# Patient Record
Sex: Female | Born: 1970 | Race: Black or African American | Hispanic: No | Smoking: Former smoker
Health system: Southern US, Community
[De-identification: ages and names within clinical notes are randomized; demographics above are authoritative.]

## PROBLEM LIST (undated history)

## (undated) DIAGNOSIS — K649 Unspecified hemorrhoids: Secondary | ICD-10-CM

## (undated) DIAGNOSIS — D649 Anemia, unspecified: Secondary | ICD-10-CM

## (undated) DIAGNOSIS — M1990 Unspecified osteoarthritis, unspecified site: Secondary | ICD-10-CM

## (undated) DIAGNOSIS — R011 Cardiac murmur, unspecified: Secondary | ICD-10-CM

## (undated) DIAGNOSIS — E7800 Pure hypercholesterolemia, unspecified: Secondary | ICD-10-CM

## (undated) DIAGNOSIS — F419 Anxiety disorder, unspecified: Secondary | ICD-10-CM

## (undated) DIAGNOSIS — M25473 Effusion, unspecified ankle: Secondary | ICD-10-CM

## (undated) DIAGNOSIS — K219 Gastro-esophageal reflux disease without esophagitis: Secondary | ICD-10-CM

## (undated) HISTORY — DX: Unspecified hemorrhoids: K64.9

## (undated) HISTORY — DX: Effusion, unspecified ankle: M25.473

## (undated) HISTORY — DX: Unspecified osteoarthritis, unspecified site: M19.90

---

## 1997-07-03 ENCOUNTER — Other Ambulatory Visit: Admission: RE | Admit: 1997-07-03 | Discharge: 1997-07-03 | Payer: Self-pay | Admitting: Obstetrics

## 1999-05-03 ENCOUNTER — Encounter: Payer: Self-pay | Admitting: Emergency Medicine

## 1999-05-03 ENCOUNTER — Emergency Department (HOSPITAL_COMMUNITY): Admission: EM | Admit: 1999-05-03 | Discharge: 1999-05-03 | Payer: Self-pay | Admitting: Emergency Medicine

## 2000-03-23 ENCOUNTER — Other Ambulatory Visit: Admission: RE | Admit: 2000-03-23 | Discharge: 2000-03-23 | Payer: Self-pay

## 2000-10-14 ENCOUNTER — Inpatient Hospital Stay (HOSPITAL_COMMUNITY): Admission: AD | Admit: 2000-10-14 | Discharge: 2000-10-17 | Payer: Self-pay | Admitting: Obstetrics

## 2000-10-14 ENCOUNTER — Encounter (INDEPENDENT_AMBULATORY_CARE_PROVIDER_SITE_OTHER): Payer: Self-pay | Admitting: *Deleted

## 2000-10-30 ENCOUNTER — Emergency Department (HOSPITAL_COMMUNITY): Admission: EM | Admit: 2000-10-30 | Discharge: 2000-10-30 | Payer: Self-pay | Admitting: Emergency Medicine

## 2001-09-13 ENCOUNTER — Emergency Department (HOSPITAL_COMMUNITY): Admission: EM | Admit: 2001-09-13 | Discharge: 2001-09-14 | Payer: Self-pay | Admitting: Emergency Medicine

## 2001-09-26 ENCOUNTER — Encounter: Payer: Self-pay | Admitting: Internal Medicine

## 2001-09-26 ENCOUNTER — Encounter: Admission: RE | Admit: 2001-09-26 | Discharge: 2001-09-26 | Payer: Self-pay | Admitting: Internal Medicine

## 2002-01-08 ENCOUNTER — Encounter: Payer: Self-pay | Admitting: Emergency Medicine

## 2002-01-08 ENCOUNTER — Emergency Department (HOSPITAL_COMMUNITY): Admission: EM | Admit: 2002-01-08 | Discharge: 2002-01-08 | Payer: Self-pay | Admitting: Emergency Medicine

## 2002-01-29 ENCOUNTER — Emergency Department (HOSPITAL_COMMUNITY): Admission: EM | Admit: 2002-01-29 | Discharge: 2002-01-29 | Payer: Self-pay | Admitting: Emergency Medicine

## 2002-01-30 ENCOUNTER — Encounter: Admission: RE | Admit: 2002-01-30 | Discharge: 2002-01-30 | Payer: Self-pay | Admitting: Internal Medicine

## 2002-01-30 ENCOUNTER — Encounter: Payer: Self-pay | Admitting: Internal Medicine

## 2002-04-29 ENCOUNTER — Inpatient Hospital Stay (HOSPITAL_COMMUNITY): Admission: AD | Admit: 2002-04-29 | Discharge: 2002-04-29 | Payer: Self-pay | Admitting: Obstetrics

## 2005-07-14 ENCOUNTER — Emergency Department (HOSPITAL_COMMUNITY): Admission: EM | Admit: 2005-07-14 | Discharge: 2005-07-15 | Payer: Self-pay | Admitting: Emergency Medicine

## 2005-12-10 ENCOUNTER — Emergency Department (HOSPITAL_COMMUNITY): Admission: EM | Admit: 2005-12-10 | Discharge: 2005-12-10 | Payer: Self-pay | Admitting: Emergency Medicine

## 2006-02-20 ENCOUNTER — Emergency Department (HOSPITAL_COMMUNITY): Admission: EM | Admit: 2006-02-20 | Discharge: 2006-02-20 | Payer: Self-pay | Admitting: Emergency Medicine

## 2006-03-10 ENCOUNTER — Encounter: Admission: RE | Admit: 2006-03-10 | Discharge: 2006-03-10 | Payer: Self-pay | Admitting: Internal Medicine

## 2006-03-17 ENCOUNTER — Encounter: Admission: RE | Admit: 2006-03-17 | Discharge: 2006-03-17 | Payer: Self-pay | Admitting: Internal Medicine

## 2006-09-09 ENCOUNTER — Encounter: Admission: RE | Admit: 2006-09-09 | Discharge: 2006-09-09 | Payer: Self-pay | Admitting: Internal Medicine

## 2007-01-13 ENCOUNTER — Emergency Department (HOSPITAL_COMMUNITY): Admission: EM | Admit: 2007-01-13 | Discharge: 2007-01-13 | Payer: Self-pay | Admitting: Emergency Medicine

## 2007-04-15 ENCOUNTER — Emergency Department (HOSPITAL_COMMUNITY): Admission: EM | Admit: 2007-04-15 | Discharge: 2007-04-15 | Payer: Self-pay | Admitting: Emergency Medicine

## 2008-10-02 ENCOUNTER — Encounter: Admission: RE | Admit: 2008-10-02 | Discharge: 2008-10-02 | Payer: Self-pay | Admitting: Internal Medicine

## 2009-05-14 ENCOUNTER — Encounter: Admission: RE | Admit: 2009-05-14 | Discharge: 2009-05-14 | Payer: Self-pay | Admitting: Neurology

## 2009-08-14 ENCOUNTER — Emergency Department (HOSPITAL_COMMUNITY): Admission: EM | Admit: 2009-08-14 | Discharge: 2009-08-15 | Payer: Self-pay | Admitting: Emergency Medicine

## 2009-09-22 ENCOUNTER — Emergency Department (HOSPITAL_COMMUNITY): Admission: EM | Admit: 2009-09-22 | Discharge: 2009-09-22 | Payer: Self-pay | Admitting: Emergency Medicine

## 2010-06-23 LAB — BASIC METABOLIC PANEL
Chloride: 108 mEq/L (ref 96–112)
Creatinine, Ser: 0.62 mg/dL (ref 0.4–1.2)
GFR calc Af Amer: >60 mL/min (ref >60)
GFR calc non Af Amer: >60 mL/min (ref >60)
Glucose, Bld: 84 mg/dL (ref 70–99)
Sodium: 138 mEq/L (ref 135–145)

## 2010-06-23 LAB — URINALYSIS, ROUTINE W REFLEX MICROSCOPIC
Bilirubin Urine: NEGATIVE
Glucose, UA: NEGATIVE mg/dL
Hgb urine dipstick: NEGATIVE
Specific Gravity, Urine: 1.008 (ref 1.005–1.030)
Urobilinogen, UA: 0.2 mg/dL (ref 0.0–1.0)
pH: 5.5 (ref 5.0–8.0)

## 2010-06-23 LAB — RAPID URINE DRUG SCREEN, HOSP PERFORMED
Amphetamines: NOT DETECTED
Barbiturates: NOT DETECTED
Benzodiazepines: NOT DETECTED
Cocaine: NOT DETECTED
Opiates: NOT DETECTED
Tetrahydrocannabinol: NOT DETECTED

## 2010-06-23 LAB — DIFFERENTIAL
Band Neutrophils: 0 % (ref 0–10)
Basophils Absolute: 0 10*3/uL (ref 0.0–0.1)
Basophils Relative: 0 % (ref 0–1)
Eosinophils Absolute: 0.1 10*3/uL (ref 0.0–0.7)
Lymphocytes Relative: 22 % (ref 12–46)
Metamyelocytes Relative: 0 %
Neutro Abs: 4.9 10*3/uL (ref 1.7–7.7)
nRBC: 0 /100 WBC

## 2010-06-23 LAB — CBC
MCV: 72.8 fL — ABNORMAL LOW (ref 78.0–100.0)
RBC: 3.5 MIL/uL — ABNORMAL LOW (ref 3.87–5.11)
WBC: 7.3 10*3/uL (ref 4.0–10.5)

## 2010-06-23 LAB — ETHANOL: Alcohol, Ethyl (B): <5 mg/dL (ref 0–10)

## 2010-06-23 LAB — GLUCOSE, CAPILLARY: Glucose-Capillary: 81 mg/dL (ref 70–99)

## 2010-06-24 LAB — URINALYSIS, ROUTINE W REFLEX MICROSCOPIC
Bilirubin Urine: NEGATIVE
Nitrite: NEGATIVE
Protein, ur: NEGATIVE mg/dL
Urobilinogen, UA: 0.2 mg/dL (ref 0.0–1.0)

## 2010-06-24 LAB — POCT CARDIAC MARKERS: Myoglobin, poc: 59.9 ng/mL (ref 12–200)

## 2010-06-24 LAB — POCT I-STAT, CHEM 8
Calcium, Ion: 1.05 mmol/L — ABNORMAL LOW (ref 1.12–1.32)
Creatinine, Ser: 0.6 mg/dL (ref 0.4–1.2)
Glucose, Bld: 99 mg/dL (ref 70–99)
Hemoglobin: 10.5 g/dL — ABNORMAL LOW (ref 12.0–15.0)

## 2010-06-24 LAB — URINE MICROSCOPIC-ADD ON

## 2010-07-02 ENCOUNTER — Other Ambulatory Visit: Payer: Self-pay | Admitting: Internal Medicine

## 2010-07-02 DIAGNOSIS — Z1231 Encounter for screening mammogram for malignant neoplasm of breast: Secondary | ICD-10-CM

## 2010-07-04 ENCOUNTER — Ambulatory Visit
Admission: RE | Admit: 2010-07-04 | Discharge: 2010-07-04 | Disposition: A | Discharge status: Home / Self Care | Payer: Medicaid Other | Source: Ambulatory Visit | Attending: Internal Medicine | Admitting: Internal Medicine

## 2010-07-04 DIAGNOSIS — Z1231 Encounter for screening mammogram for malignant neoplasm of breast: Secondary | ICD-10-CM

## 2010-08-22 NOTE — Discharge Summary (Signed)
[facility]  Patient:    Audrey Rodriguez, Audrey Rodriguez                        MRN: [id number] Adm. Date:  [id number] Disc. Date: [id number] Attending:  Benny Lennert                           Discharge Summary  HISTORY:                      Patient is a 40 year old gravida 5, para 4-0-0-4 who had four previous cesarean sections who was now at term and was in for repeat cesarean section and tubal ligation.  On admission her hemoglobin was 11.5.  She underwent a repeat low transverse cesarean section and tubal ligation.  Had a female Apgar 9/9 weighing 6 pounds 5 ounces.  Postoperatively she did well.  Her hemoglobin was 10.3.  She was discharged home on the third postoperative day, ambulatory, on a regular diet to see me in six weeks.  DISCHARGE DIAGNOSES:          Status post repeat low transverse cesarean section at term and tubal ligation.DD:  11/04/00 TD:  11/04/00 Job: [id number]

## 2010-08-22 NOTE — Op Note (Signed)
[facility]  Patient:    Audrey Rodriguez, Audrey Rodriguez                        MRN: [id number] Proc. Date: 10/14/00 Adm. Date:  [id number] Attending:  Venita Sheffield                           Operative Report  PREOPERATIVE DIAGNOSIS:       Previous cesarean section at term for repeat cesarean section and tubal ligation.  POSTOPERATIVE DIAGNOSIS:      ______  OPERATION:                    Repeat cesarean section and tubal ligation.  SURGEON:                      Kathreen Cosier, M.D.  ASSISTANT:                    Bing Neighbors. Clearance Coots, M.D.  ANESTHESIA:                   Spinal.  ESTIMATED BLOOD LOSS:         400 cc.  DESCRIPTION OF PROCEDURE:     The patient was placed on the operating table in supine position. The abdomen was prepped and draped. The bladder was emptied with a Foley catheter. A midline incision was made through the old scar and carried down to the rectus fascia. The fascia was cleanly incised the length of the incision. The rectus muscles were retracted laterally. The peritoneum was incised the length of the incision. There was noted to be a window in the lower segment and the scalpel was used to incise this and with blunt dissection, the lower segment was opened, fluid was clear. The patient delivered from the OA position of a female, Apgars 9/9, weighing 6 pounds 5 ounces. There was nuchal cord x 1. The placenta was then ______ and removed manually. The uterine cavity was cleaned with dry laps. The uterine incision was closed in one layer with continuous suture of #1 chromic. Hemostasis was satisfactory. The right tube was grasped in the mid portion with a Babcock clamp, 0 plain suture placed on the mesosalpinx below the portion of tube in the clamp. This was tied and approximately one inch of tube transected. The procedure done in exact fashion on the other side. Lap and sponge counts correct. The abdomen was closed in layers;  the peritoneum with continuous suture of 0 chromic; the fascia with continuous suture of 0 Dexon, and the subcutaneous tissue closed with 2-0 plain and the skin closed with staples. Blood loss was 400 cc. The patient tolerated the procedure well and taken to recovery room in good condition. DD:  10/14/00 TD:  10/14/00 Job: [id number]

## 2010-12-25 LAB — I-STAT 8, (EC8 V) (CONVERTED LAB)
BUN: 14
Glucose, Bld: 88
HCT: 36
Hemoglobin: 12.2
Operator id: 161631
Potassium: 3.4 — ABNORMAL LOW
Sodium: 139
TCO2: 23

## 2010-12-25 LAB — CBC
Hemoglobin: 10.6 — ABNORMAL LOW
MCHC: 33
Platelets: 248
RDW: 13.8
WBC: 7.2

## 2010-12-25 LAB — POCT CARDIAC MARKERS: Operator id: 161631

## 2010-12-25 LAB — D-DIMER, QUANTITATIVE: D-Dimer, Quant: 0.29

## 2010-12-26 ENCOUNTER — Other Ambulatory Visit: Payer: Self-pay | Admitting: Obstetrics and Gynecology

## 2010-12-29 ENCOUNTER — Encounter (HOSPITAL_COMMUNITY): Payer: Self-pay | Admitting: *Deleted

## 2010-12-30 NOTE — H&P (Signed)
NAME:  Audrey Rodriguez, PADEN                 ACCOUNT NO.:  [id number]  MEDICAL RECORD NO.:  [id number]  LOCATION:                                 FACILITY:  PHYSICIAN:  Janine Limbo, M.D.DATE OF BIRTH:  30-Sep-1970  DATE OF ADMISSION: DATE OF DISCHARGE:                             HISTORY & PHYSICAL   HISTORY OF PRESENT ILLNESS:  Audrey Rodriguez is a 40 year old female, para 5-0- 0-5, who presents for hysteroscopy with dilatation and curettage.  The patient has [facility].  The patient has a known history of fibroids.  She complains of menorrhagia.  She has a known history of anemia.  She is currently taking vitamins and iron tablets.  An ultrasound was performed which showed an 11.36 x 4.88 cm uterus with multiple fibroids.  The largest fibroid measured 3.94 cm.  A hydrosonogram was performed and no endometrial polyps were noted.  The patient has had five cesarean sections in the past.  She has had a tubal ligation.  OBSTETRICAL HISTORY:  The patient has had five cesarean sections at term.  ALLERGIES:  No drug allergies, latex allergies, or Betadine allergies.  PAST MEDICAL HISTORY:  The patient has a history of anemia as mentioned above.  She has a history of hemorrhoids.  SOCIAL HISTORY:  The patient denies cigarette use, alcohol use, and recreational drug use.  She works as a Education administrator.  REVIEW OF SYSTEMS:  See history of present illness.  FAMILY HISTORY:  Noncontributory.  PHYSICAL EXAMINATION:  VITAL SIGNS:  Weight is 163 pounds.  Height is 5 feet, even. HEENT:  Within normal limits. CHEST:  Clear. HEART:  Regular rate and rhythm. BREASTS:  Without masses. ABDOMEN:  Nontender. EXTREMITIES:  Grossly normal. NEUROLOGIC:  Grossly normal. PELVIC:  External genitalia is normal.  Vagina is normal.  Cervix is nontender.  Uterus is approximately 12 weeks' size and  irregular. Adnexa no masses.  Rectovaginal exam confirms.  ASSESSMENT: 1. Menorrhagia. 2. Fibroid uterus. 3. Anemia.  PLAN:  The patient will undergo hysteroscopy with a dilatation and curettage.  She understands the indications for her surgical procedure, and she accepts the risks of, but not limited to, anesthetic complications, bleeding, infection, and possible damage to the surrounding organs.     Janine Limbo, M.D.     AVS/MEDQ  D:  12/30/2010  T:  12/30/2010  Job:  [id number]  cc:   Fleet Contras, M.D. Fax: [phone]

## 2010-12-31 ENCOUNTER — Ambulatory Visit (HOSPITAL_COMMUNITY)
Admission: RE | Admit: 2010-12-31 | Discharge: 2010-12-31 | Disposition: A | Discharge status: Home / Self Care | Payer: Medicaid Other | Source: Ambulatory Visit | Attending: Obstetrics and Gynecology | Admitting: Obstetrics and Gynecology

## 2010-12-31 ENCOUNTER — Encounter (HOSPITAL_COMMUNITY): Payer: Self-pay | Admitting: Anesthesiology

## 2010-12-31 ENCOUNTER — Ambulatory Visit (HOSPITAL_COMMUNITY): Payer: Medicaid Other | Admitting: Anesthesiology

## 2010-12-31 ENCOUNTER — Other Ambulatory Visit: Payer: Self-pay | Admitting: Obstetrics and Gynecology

## 2010-12-31 ENCOUNTER — Encounter (HOSPITAL_COMMUNITY)
Admission: RE | Disposition: A | Discharge status: Home / Self Care | Payer: Self-pay | Source: Ambulatory Visit | Attending: Obstetrics and Gynecology

## 2010-12-31 DIAGNOSIS — N920 Excessive and frequent menstruation with regular cycle: Secondary | ICD-10-CM | POA: Insufficient documentation

## 2010-12-31 DIAGNOSIS — Z8742 Personal history of other diseases of the female genital tract: Secondary | ICD-10-CM

## 2010-12-31 DIAGNOSIS — D649 Anemia, unspecified: Secondary | ICD-10-CM | POA: Insufficient documentation

## 2010-12-31 DIAGNOSIS — N840 Polyp of corpus uteri: Secondary | ICD-10-CM | POA: Insufficient documentation

## 2010-12-31 HISTORY — DX: Anemia, unspecified: D64.9

## 2010-12-31 HISTORY — DX: Cardiac murmur, unspecified: R01.1

## 2010-12-31 HISTORY — DX: Anxiety disorder, unspecified: F41.9

## 2010-12-31 HISTORY — DX: Gastro-esophageal reflux disease without esophagitis: K21.9

## 2010-12-31 LAB — CBC
MCH: 26.3 pg (ref 26.0–34.0)
Platelets: 341 10*3/uL (ref 150–400)
RBC: 4.1 MIL/uL (ref 3.87–5.11)
RDW: 14.7 % (ref 11.5–15.5)
WBC: 5.4 10*3/uL (ref 4.0–10.5)

## 2010-12-31 SURGERY — DILATATION & CURETTAGE/HYSTEROSCOPY WITH RESECTOCOPE
Anesthesia: General | Site: Vagina | Wound class: Clean Contaminated

## 2010-12-31 MED ORDER — MIDAZOLAM HCL 5 MG/5ML IJ SOLN
INTRAMUSCULAR | Status: DC | PRN
  Administered 2010-12-31: 2 mg via INTRAVENOUS

## 2010-12-31 MED ORDER — DEXAMETHASONE SODIUM PHOSPHATE 10 MG/ML IJ SOLN
INTRAMUSCULAR | Status: DC | PRN
  Administered 2010-12-31: 10 mg via INTRAVENOUS

## 2010-12-31 MED ORDER — ONDANSETRON HCL 4 MG/2ML IJ SOLN
INTRAMUSCULAR | Status: AC
  Filled 2010-12-31: qty 2

## 2010-12-31 MED ORDER — DEXAMETHASONE SODIUM PHOSPHATE 10 MG/ML IJ SOLN
INTRAMUSCULAR | Status: AC
  Filled 2010-12-31: qty 1

## 2010-12-31 MED ORDER — IBUPROFEN 200 MG PO TABS: 800.0000 mg | ORAL_TABLET | Freq: Three times a day (TID) | ORAL | Status: AC | PRN

## 2010-12-31 MED ORDER — MORPHINE SULFATE 0.5 MG/ML IJ SOLN
INTRAMUSCULAR | Status: AC
  Filled 2010-12-31: qty 10

## 2010-12-31 MED ORDER — MIDAZOLAM HCL 2 MG/2ML IJ SOLN
INTRAMUSCULAR | Status: AC
  Filled 2010-12-31: qty 2

## 2010-12-31 MED ORDER — LACTATED RINGERS IV SOLN
INTRAVENOUS | Status: DC
  Administered 2010-12-31: 10:00:00 via INTRAVENOUS

## 2010-12-31 MED ORDER — BUPIVACAINE-EPINEPHRINE 0.5% -1:200000 IJ SOLN
INTRAMUSCULAR | Status: DC | PRN
  Administered 2010-12-31: 10 mL

## 2010-12-31 MED ORDER — PROPOFOL 10 MG/ML IV EMUL
INTRAVENOUS | Status: AC
  Filled 2010-12-31: qty 20

## 2010-12-31 MED ORDER — LIDOCAINE HCL (CARDIAC) 20 MG/ML IV SOLN
INTRAVENOUS | Status: DC | PRN
  Administered 2010-12-31: 60 mg via INTRAVENOUS

## 2010-12-31 MED ORDER — KETOROLAC TROMETHAMINE 30 MG/ML IJ SOLN
INTRAMUSCULAR | Status: DC | PRN
  Administered 2010-12-31: 30 mg via INTRAMUSCULAR
  Administered 2010-12-31: 30 mg via INTRAVENOUS

## 2010-12-31 MED ORDER — GLYCINE 1.5 % IR SOLN
Status: DC | PRN
  Administered 2010-12-31: 3000 mL

## 2010-12-31 MED ORDER — FENTANYL CITRATE 0.05 MG/ML IJ SOLN
INTRAMUSCULAR | Status: DC | PRN
  Administered 2010-12-31 (×2): 50 ug via INTRAVENOUS

## 2010-12-31 MED ORDER — FENTANYL CITRATE 0.05 MG/ML IJ SOLN
INTRAMUSCULAR | Status: AC
  Filled 2010-12-31: qty 2

## 2010-12-31 MED ORDER — LIDOCAINE HCL (CARDIAC) 20 MG/ML IV SOLN
INTRAVENOUS | Status: AC
  Filled 2010-12-31: qty 5

## 2010-12-31 MED ORDER — KETOROLAC TROMETHAMINE 60 MG/2ML IM SOLN
INTRAMUSCULAR | Status: AC
  Filled 2010-12-31: qty 2

## 2010-12-31 MED ORDER — PROPOFOL 10 MG/ML IV EMUL
INTRAVENOUS | Status: DC | PRN
  Administered 2010-12-31: 170 mg via INTRAVENOUS

## 2010-12-31 MED ORDER — HYDROCODONE-ACETAMINOPHEN 5-500 MG PO TABS: 1.0000 | ORAL_TABLET | ORAL | Status: AC | PRN

## 2010-12-31 MED ORDER — ONDANSETRON HCL 4 MG/2ML IJ SOLN
INTRAMUSCULAR | Status: DC | PRN
  Administered 2010-12-31: 4 mg via INTRAVENOUS

## 2010-12-31 SURGICAL SUPPLY — 18 items
CANISTER SUCTION 2500CC (MISCELLANEOUS) ×2 IMPLANT
CATH ROBINSON RED A/P 16FR (CATHETERS) ×2 IMPLANT
CLOTH BEACON ORANGE TIMEOUT ST (SAFETY) ×2 IMPLANT
CONTAINER PREFILL 10% NBF 60ML (FORM) ×7 IMPLANT
DRAPE UTILITY XL STRL (DRAPES) ×2 IMPLANT
ELECT REM PT RETURN 9FT ADLT (ELECTROSURGICAL) ×2
ELECTRODE REM PT RTRN 9FT ADLT (ELECTROSURGICAL) IMPLANT
ELECTRODE ROLLER BARREL 22FR (ELECTROSURGICAL) IMPLANT
ELECTRODE VAPORCUT 22FR (ELECTROSURGICAL) IMPLANT
GLOVE BIOGEL PI IND STRL 8.5 (GLOVE) ×1 IMPLANT
GLOVE BIOGEL PI INDICATOR 8.5 (GLOVE) ×1
GLOVE ECLIPSE 8.0 STRL XLNG CF (GLOVE) ×4 IMPLANT
GOWN STRL NON-REIN LRG LVL3 (GOWN DISPOSABLE) ×2 IMPLANT
GOWN STRL REIN XL XLG (GOWN DISPOSABLE) ×2 IMPLANT
LOOP ANGLED CUTTING 22FR (CUTTING LOOP) ×1 IMPLANT
PACK HYSTEROSCOPY LF (CUSTOM PROCEDURE TRAY) ×2 IMPLANT
TOWEL OR 17X24 6PK STRL BLUE (TOWEL DISPOSABLE) ×4 IMPLANT
WATER STERILE IRR 1000ML POUR (IV SOLUTION) ×2 IMPLANT

## 2010-12-31 NOTE — H&P (Signed)
The patient was interviewed and examined today.  There are no changes to the previously documented history and physical examine. The operative procedure was reviewed. The risks and benefits were outlined again.  The patient's questions were answered.  We are ready to proceed as outlined.  

## 2010-12-31 NOTE — Anesthesia Preprocedure Evaluation (Addendum)
Anesthesia Evaluation  Name, MR# and DOB Patient awake  General Assessment Comment  Reviewed: Allergy & Precautions, H&P , NPO status , Patient's Chart, lab work & pertinent test results  Airway Mallampati: III TM Distance: >3 FB Neck ROM: full    Dental No notable dental hx. (+) Teeth Intact   Pulmonary  clear to auscultation  pulmonary exam normalPulmonary Exam Normal breath sounds clear to auscultation none    Cardiovascular regular Normal    Neuro/Psych    (+) Anxiety,  Negative Neurological ROS  Negative Psych ROS  GI/Hepatic/Renal negative GI ROS  negative Liver ROS  negative Renal ROS   Controlled     Endo/Other  Negative Endocrine ROS (+)      Abdominal   Musculoskeletal negative musculoskeletal ROS (+)   Hematology negative hematology ROS (+)   Peds  Reproductive/Obstetrics negative OB ROS    Anesthesia Other Findings            Anesthesia Physical Anesthesia Plan  ASA: II  Anesthesia Plan: General   Post-op Pain Management:    Induction:   Airway Management Planned:   Additional Equipment:   Intra-op Plan:   Post-operative Plan:   Informed Consent: I have reviewed the patients History and Physical, chart, labs and discussed the procedure including the risks, benefits and alternatives for the proposed anesthesia with the patient or authorized representative who has indicated his/her understanding and acceptance.   Dental Advisory Given  Plan Discussed with: Anesthesiologist  Anesthesia Plan Comments:         Anesthesia Quick Evaluation

## 2010-12-31 NOTE — Transfer of Care (Signed)
Immediate Anesthesia Transfer of Care Note  Patient: Audrey Rodriguez  Procedure(s) Performed:  DILATATION & CURETTAGE/HYSTEROSCOPY WITH RESECTOSCOPE - Dilatation & Currettage With Hysteroscopy; Resection Of Submucosal Fibroid  Patient Location: PACU  Anesthesia Type: General  Level of Consciousness: awake, alert , oriented and patient cooperative  Airway & Oxygen Therapy: Patient Spontanous Breathing and Patient connected to nasal cannula oxygen  Post-op Assessment: Report given to PACU RN and Post -op Vital signs reviewed and stable  Post vital signs: Reviewed  Complications: No apparent anesthesia complications

## 2010-12-31 NOTE — Preoperative (Signed)
Beta Blockers   Reason not to administer Beta Blockers:Not Applicable 

## 2010-12-31 NOTE — Op Note (Addendum)
Operative note  Audrey Rodriguez  Date of birth: Nov 29, 1970  Medical records number: [id number]  CSN: [id number]  Preoperative diagnosis:  Menorrhagia  Fibroids  Anemia (hemoglobin equals 10.8)  Postoperative diagnosis:  Menorrhagia  Fibroids  Anemia  Endometrial Polyps  Procedure:  Hysteroscopy with resection  Dilatation and curettage  Surgeon:  Leonard Schwartz M.D.  Assistant:  None  Anesthesia:  General  Disposition:  Audrey Rodriguez is a 40 year old female, para 5-0-0-5, who presents with menorrhagia and anemia. She understands the indications for surgical procedure and she accepts the risk of, but not limited to, anesthetic complications, bleeding, infections, and possible damage to the surrounding organs.  Findings:  The uterus is 12 week size. The patient was found to have 2 polyps within the endometrial cavity with the largest polyp measuring 1 cm. No other pathology was appreciated.  Procedure:  The patient was taken to the operating room where a general anesthetic was given. The patient's lower abdomen, perineum, and vagina were prepped with multiple layers of Betadine. The bladder was drained of urine. The patient was sterilely draped. Examination under anesthesia was performed. The patient was sterilely draped. A paracervical block was placed using half percent Marcaine with epinephrine. An endocervical curettage was performed. The uterus sounded to 12 cm. The cervix was gently dilated. The diagnostic hysteroscope was inserted and the cavity was carefully inspected. Pictures were taken. The diagnostic hysteroscope was removed. The cervix was dilated further. The operative hysteroscope was then inserted. The endometrial polyps were resected using a single loop. The hysteroscope was removed. The cavity was then curetted using a small curette. The cavity was felt to be clean at the end of our procedure. The hysteroscope was reinserted and the clean cavity  was confirmed. All instruments were removed. The exam was repeated and the uterus was noted to be firm. Needle and sponge counts were correct. The estimated blood loss was 20 cc. The estimated fluid deficit was 180 cc although through a loss noted on the operating room for. The patient tolerated her procedure well. Her anesthetic was reversed. She was transported to the recovery room in stable condition.  Followup instructions:  The patient will return to see Dr. Stefano Gaul in 2 weeks. She will call for questions or concerns. She was given a prescription for Motrin 800 mg every 8 hours as needed for pain and for Vicodin 5/500 and she will take one or 2 tablets every 4 hours as needed for severe pain.  Mylinda Latina.D.

## 2010-12-31 NOTE — Anesthesia Postprocedure Evaluation (Signed)
  Anesthesia Post-op Note  Patient: Audrey Rodriguez  Procedure(s) Performed:  DILATATION & CURETTAGE/HYSTEROSCOPY WITH RESECTOSCOPE - Dilatation & Currettage With Hysteroscopy; Resection Of Submucosal Fibroid  Patient Location: PACU  Anesthesia Type: General  Level of Consciousness: awake, alert  and oriented  Airway and Oxygen Therapy: Patient Spontanous Breathing  Post-op Pain: none  Post-op Assessment: Post-op Vital signs reviewed, Patient's Cardiovascular Status Stable, Respiratory Function Stable, Patent Airway, No signs of Nausea or vomiting and Pain level controlled  Post-op Vital Signs: Reviewed and stable  Complications: No apparent anesthesia complications

## 2011-01-15 LAB — DIFFERENTIAL
Basophils Absolute: 0
Eosinophils Absolute: 0.1
Eosinophils Relative: 1
Lymphocytes Relative: 26
Monocytes Absolute: 0.6

## 2011-01-15 LAB — URINALYSIS, ROUTINE W REFLEX MICROSCOPIC
Glucose, UA: NEGATIVE
Nitrite: NEGATIVE
Specific Gravity, Urine: 1.019
pH: 7

## 2011-01-15 LAB — CBC
HCT: 28.5 — ABNORMAL LOW
Hemoglobin: 9.4 — ABNORMAL LOW
MCHC: 32.9
MCV: 82.9
RDW: 14.1 — ABNORMAL HIGH

## 2011-01-15 LAB — URINE CULTURE: Colony Count: >=100000

## 2011-06-08 ENCOUNTER — Other Ambulatory Visit: Payer: Self-pay | Admitting: Internal Medicine

## 2011-06-08 DIAGNOSIS — Z1231 Encounter for screening mammogram for malignant neoplasm of breast: Secondary | ICD-10-CM

## 2011-07-08 ENCOUNTER — Ambulatory Visit
Admission: RE | Admit: 2011-07-08 | Discharge: 2011-07-08 | Disposition: A | Discharge status: Home / Self Care | Payer: Medicaid Other | Source: Ambulatory Visit | Attending: Internal Medicine | Admitting: Internal Medicine

## 2011-07-08 DIAGNOSIS — Z1231 Encounter for screening mammogram for malignant neoplasm of breast: Secondary | ICD-10-CM

## 2012-02-11 ENCOUNTER — Telehealth: Payer: Self-pay | Admitting: Obstetrics and Gynecology

## 2012-02-11 ENCOUNTER — Ambulatory Visit: Payer: Self-pay | Admitting: Obstetrics and Gynecology

## 2012-02-24 ENCOUNTER — Ambulatory Visit (INDEPENDENT_AMBULATORY_CARE_PROVIDER_SITE_OTHER): Payer: Medicaid Other | Admitting: Obstetrics and Gynecology

## 2012-02-24 ENCOUNTER — Encounter: Payer: Self-pay | Admitting: Obstetrics and Gynecology

## 2012-02-24 VITALS — BP 110/60 | HR 68 | Resp 16 | Ht 61.0 in | Wt 162.0 lb

## 2012-02-24 DIAGNOSIS — D259 Leiomyoma of uterus, unspecified: Secondary | ICD-10-CM

## 2012-02-24 DIAGNOSIS — Z124 Encounter for screening for malignant neoplasm of cervix: Secondary | ICD-10-CM

## 2012-02-24 DIAGNOSIS — D219 Benign neoplasm of connective and other soft tissue, unspecified: Secondary | ICD-10-CM | POA: Insufficient documentation

## 2012-02-24 DIAGNOSIS — Z113 Encounter for screening for infections with a predominantly sexual mode of transmission: Secondary | ICD-10-CM

## 2012-02-24 DIAGNOSIS — Z0000 Encounter for general adult medical examination without abnormal findings: Secondary | ICD-10-CM

## 2012-02-24 LAB — HIV ANTIBODY (ROUTINE TESTING W REFLEX): HIV: NONREACTIVE

## 2012-02-24 NOTE — Patient Instructions (Signed)
Over the counter Colace daily for constipation

## 2012-02-24 NOTE — Progress Notes (Signed)
Last Pap: 2008 from our records.No others done per patient.  No hx abnormal WNL: yes Regular Periods:yes Contraception: BTL  Monthly Breast exam:no Tetanus<89yrs:yes Nl.Bladder Function:yes Daily BMs:yes Healthy Diet:yes Calcium:no Mammogram:yes Date of Mammogram: 05/2011 WNL Exercise:yes Have often Exercise: 5 x a month Seatbelt: yes Abuse at home: no Stressful work:no Sigmoid-colonoscopy: None Bone Density: No PCP: [facility] Change in PMH: None Change in ZOX:WRUEAV died January 26, 2012   Subjective:    Audrey Rodriguez is a 41 y.o. female,  who presents for an annual exam. She has a hx of menorrhagia and underwent a hysteroscopy D&C a year ago with some decrease in monthly bleeding.  She is treated for anemia by her PCP and has constipation as a result of her iron therapy with hemorrhoids, but no rectal bleeding.    History   Social History  . Marital Status: Married    Spouse Name: N/A    Number of Children: N/A  . Years of Education: N/A   Social History Main Topics  . Smoking status: Former Smoker    Quit date: 12/28/1997  . Smokeless tobacco: None  . Alcohol Use: No  . Drug Use: No  . Sexually Active: Yes     Comment: BTL   Other Topics Concern  . None   Social History Narrative  . None    Menstrual cycle:   LMP: Patient's last menstrual period was 01/29/2012.           Cycle: q 28 days.  No IM bleeding.  No cramps.  Heavy for 4 days then slows.  The following portions of the patient's history were reviewed and updated as appropriate: allergies, current medications, past family history, past medical history, past social history, past surgical history and problem list.  Review of Systems Pertinent items are noted in HPI. Breast:Negative for breast lump,nipple discharge or nipple retraction Gastrointestinal: Negative for abdominal pain, change in bowel habits or rectal bleeding Urinary:negative   Objective:    BP 110/60  Pulse 68  Resp 16  Ht 5\' 1"   (1.549 m)  Wt 162 lb (73.483 kg)  BMI 30.61 kg/m2  LMP 01/29/2012    Weight:  Wt Readings from Last 1 Encounters:  02/24/12 162 lb (73.483 kg)          BMI: Body mass index is 30.61 kg/(m^2).  General Appearance: Alert, appropriate appearance for age. No acute distress HEENT: Grossly normal Neck / Thyroid: Supple, no masses, nodes or enlargement Lungs: clear to auscultation bilaterally Back: No CVA tenderness Breast Exam: No masses or nodes.No dimpling, nipple retraction or discharge. Cardiovascular: Regular rate and rhythm. S1, S2, no murmur Gastrointestinal: Soft, non-tender, no masses or organomegaly Pelvic Exam: External genitalia: normal general appearance Vaginal: normal rugae Cervix: normal appearance Adnexa: no separable masses Uterus: 12 weeks size with decreased mobility Rectovaginal: normal rectal, no masses Lymphatic Exam: Non-palpable nodes in neck, clavicular, axillary, or inguinal regions Skin: no rash or abnormalities Neurologic: Normal gait and speech, no tremor  Psychiatric: Alert and oriented, appropriate affect.   Wet Prep:not applicable Urinalysis:not applicable UPT: Not done   Assessment:    Fibroids   Menorrhagia, improved Anemia, under treatement Constipation related to meds Hemorrhoids Plan:    Mammogram due 2014 Feb. pap smear with HR HPV additional lab tests per orders, HGB OTC colace daily return annually or prn STD screening: declined Contraception:bilateral tubal ligation   Dierdre Forth MD

## 2012-02-26 LAB — PAP IG, CT-NG NAA, HPV HIGH-RISK: HPV DNA High Risk: NOT DETECTED

## 2012-03-07 ENCOUNTER — Encounter: Payer: Self-pay | Admitting: Obstetrics and Gynecology

## 2012-03-07 ENCOUNTER — Ambulatory Visit (INDEPENDENT_AMBULATORY_CARE_PROVIDER_SITE_OTHER): Payer: Medicaid Other | Admitting: Obstetrics and Gynecology

## 2012-03-07 VITALS — BP 140/80 | Temp 98.1°F | Ht 61.0 in | Wt 164.0 lb

## 2012-03-07 DIAGNOSIS — B009 Herpesviral infection, unspecified: Secondary | ICD-10-CM

## 2012-03-07 DIAGNOSIS — A609 Anogenital herpesviral infection, unspecified: Secondary | ICD-10-CM

## 2012-03-07 DIAGNOSIS — B1920 Unspecified viral hepatitis C without hepatic coma: Secondary | ICD-10-CM

## 2012-03-07 NOTE — Progress Notes (Signed)
HISTORY OF PRESENT ILLNESS  [name] is a 41 y.o. year old female,G5P5, who presents for a problem visit. Her Pap smear from November 2013 is within normal limits. The patient had screening tests done for sexually transmitted infections. Her HIV screen, high risk HPV,RPR, gonorrhea, Chlamydia, and hepatitis B test were negative. 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  Subjective:  The patient reports that she has had fever blisters in the past.  She has had a vulvar blisters in the past.  She has never been told that she had hepatitis C.  Her husband was incarcerated 2 years ago.  The patient reports that her husband told her that all of his tests were "negative".  Objective:  BP 140/80  Temp 98.1 F (36.7 C) (Oral)  Ht 5\' 1"  (1.549 m)  Wt 164 lb (74.39 kg)  BMI 30.99 kg/m2  LMP 03/04/2012   General: no distress GI: soft and nontender HEENT: Sclera are non-icteric.  Exam deferred.  Assessment:  Herpes virus 1  Herpes virus 2  Hepatitis C  Plan:  The above diagnoses were discussed.  We will schedule an appointment for the patient to be seen in the hepatitis clinic per her request.  Her husband is to be evaluated and treated as appropriate.  Return to office in 11  month(s).  25 min. Visit with 50% being face-to-face.   Leonard Schwartz M.D.  03/07/2012 5:52 PM

## 2012-03-08 ENCOUNTER — Other Ambulatory Visit: Payer: Self-pay

## 2012-03-08 ENCOUNTER — Telehealth: Payer: Self-pay

## 2012-03-08 DIAGNOSIS — B1920 Unspecified viral hepatitis C without hepatic coma: Secondary | ICD-10-CM

## 2012-03-08 NOTE — Telephone Encounter (Signed)
TC made  to the Hepatitis C Clinic today @ 9:55am  the office number was given to me by the Infectious Disease office. [phone]. No one was ava at this time   their voicemail stated if calling from another office for a referral to leave a fax number So that a Referral form can be faxed to our office I left a detailed brief message with the office contact number and fax #. Will await referral forms and call back with in 48 hours per their voicemail.  [facility] CMA

## 2012-03-08 NOTE — Telephone Encounter (Signed)
Referral Faxed to [facility] Attached were demo, ins card, pt last  clinical notes, results.  [facility] CMA

## 2012-03-09 ENCOUNTER — Telehealth: Payer: Self-pay | Admitting: Obstetrics and Gynecology

## 2012-03-10 ENCOUNTER — Telehealth: Payer: Self-pay | Admitting: Obstetrics and Gynecology

## 2012-03-10 ENCOUNTER — Telehealth: Payer: Self-pay

## 2012-03-10 NOTE — Telephone Encounter (Signed)
Pt was called back today. Pt not ava. Left Message for pt to call back.  [facility] CMA

## 2012-03-10 NOTE — Telephone Encounter (Signed)
Latoya, please see msgcl

## 2012-03-11 ENCOUNTER — Telehealth: Payer: Self-pay

## 2012-03-11 NOTE — Telephone Encounter (Signed)
Pt called back 03-10-12 And was made aware referral was faxed on 03-09-12 to the hepatitis C clinic And for her to allow 24-48 hours for them to call and schedule her an appt.  [facility] CMA

## 2012-07-04 ENCOUNTER — Other Ambulatory Visit: Payer: Self-pay

## 2012-07-04 DIAGNOSIS — Z1231 Encounter for screening mammogram for malignant neoplasm of breast: Secondary | ICD-10-CM

## 2012-08-05 ENCOUNTER — Ambulatory Visit: Payer: Medicaid Other

## 2012-09-13 ENCOUNTER — Ambulatory Visit
Admission: RE | Admit: 2012-09-13 | Discharge: 2012-09-13 | Disposition: A | Discharge status: Home / Self Care | Payer: Medicaid Other | Source: Ambulatory Visit

## 2012-09-13 DIAGNOSIS — Z1231 Encounter for screening mammogram for malignant neoplasm of breast: Secondary | ICD-10-CM

## 2012-11-01 ENCOUNTER — Emergency Department (HOSPITAL_COMMUNITY)
Admission: EM | Admit: 2012-11-01 | Discharge: 2012-11-01 | Disposition: A | Discharge status: Home / Self Care | Payer: Self-pay

## 2012-11-01 ENCOUNTER — Encounter (HOSPITAL_COMMUNITY): Payer: Self-pay | Admitting: Emergency Medicine

## 2012-11-01 ENCOUNTER — Emergency Department (HOSPITAL_COMMUNITY)
Admission: EM | Admit: 2012-11-01 | Discharge: 2012-11-01 | Disposition: A | Discharge status: Home / Self Care | Payer: Medicaid Other | Attending: Emergency Medicine | Admitting: Emergency Medicine

## 2012-11-01 DIAGNOSIS — Z862 Personal history of diseases of the blood and blood-forming organs and certain disorders involving the immune mechanism: Secondary | ICD-10-CM | POA: Insufficient documentation

## 2012-11-01 DIAGNOSIS — M25559 Pain in unspecified hip: Secondary | ICD-10-CM | POA: Insufficient documentation

## 2012-11-01 DIAGNOSIS — Z8719 Personal history of other diseases of the digestive system: Secondary | ICD-10-CM | POA: Insufficient documentation

## 2012-11-01 DIAGNOSIS — Z8659 Personal history of other mental and behavioral disorders: Secondary | ICD-10-CM | POA: Insufficient documentation

## 2012-11-01 DIAGNOSIS — K921 Melena: Secondary | ICD-10-CM | POA: Insufficient documentation

## 2012-11-01 DIAGNOSIS — Z7982 Long term (current) use of aspirin: Secondary | ICD-10-CM | POA: Insufficient documentation

## 2012-11-01 DIAGNOSIS — Z87891 Personal history of nicotine dependence: Secondary | ICD-10-CM | POA: Insufficient documentation

## 2012-11-01 DIAGNOSIS — R011 Cardiac murmur, unspecified: Secondary | ICD-10-CM | POA: Insufficient documentation

## 2012-11-01 DIAGNOSIS — K649 Unspecified hemorrhoids: Secondary | ICD-10-CM | POA: Insufficient documentation

## 2012-11-01 LAB — CBC WITH DIFFERENTIAL/PLATELET
Basophils Absolute: 0 10*3/uL (ref 0.0–0.1)
Eosinophils Absolute: 0.1 10*3/uL (ref 0.0–0.7)
Eosinophils Relative: 1 % (ref 0–5)
MCH: 27.2 pg (ref 26.0–34.0)
MCV: 82.5 fL (ref 78.0–100.0)
Platelets: 321 10*3/uL (ref 150–400)
RDW: 13.3 % (ref 11.5–15.5)

## 2012-11-01 LAB — COMPREHENSIVE METABOLIC PANEL
ALT: 20 U/L (ref 0–35)
AST: 20 U/L (ref 0–37)
Calcium: 9.2 mg/dL (ref 8.4–10.5)
Creatinine, Ser: 0.72 mg/dL (ref 0.50–1.10)
GFR calc Af Amer: >90 mL/min (ref >90)
Glucose, Bld: 112 mg/dL — ABNORMAL HIGH (ref 70–99)
Sodium: 137 mEq/L (ref 135–145)
Total Protein: 7 g/dL (ref 6.0–8.3)

## 2012-11-01 MED ORDER — HYDROCORTISONE 2.5 % RE CREA: TOPICAL_CREAM | Freq: Two times a day (BID) | RECTAL | Status: DC

## 2012-11-01 MED ORDER — HYDROCORTISONE ACETATE 25 MG RE SUPP: 25.0000 mg | Freq: Two times a day (BID) | RECTAL | Status: DC

## 2012-11-01 NOTE — ED Notes (Signed)
Pt c/o rectal bleeding x 3 days; pt denies pain and sts may have hemorrhoids

## 2012-11-01 NOTE — ED Notes (Signed)
Pt reports rectal bleeding X3 days.  States she has had this in past and may have hemorroids again.  Pt states she has very little pain 2/10 mostly uncomfortable.  Pt alert oriented X4

## 2012-11-01 NOTE — ED Provider Notes (Signed)
I saw and evaluated the patient, reviewed the resident's note and I agree with the findings and plan. Patient with a history of lower GI bleeding and has been asymptomatic for the last 2 days. Patient states occasionally the blood is dark and sometimes bright red. It occurs with every bowel movement. She denies any symptoms concerning for symptomatic anemia. On anoscopy visualized a an internal hemorrhoid that was engorged at 7:00 but not actively bleeding. Patient's symptoms may all be do to an internal hemorrhoid however this also could be diverticulosis. Recommended followup with GI and gave strict return precautions for any symptoms concerning for symptomatic anemia  Gwyneth Sprout, MD 11/01/12 1840

## 2012-11-01 NOTE — ED Provider Notes (Signed)
CSN: [id number]     Arrival date & time 11/01/12  1244 History     First MD Initiated Contact with Patient 11/01/12 1458     Chief Complaint  Patient presents with  . 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  She reports that she has had external hemorrhoids for past 7 years with painless rectal bleeding with BM on and off but never with every BM back to back. She does not remember when the last episode was.  For the past 2 days she had painless rectal bleeding with every BM. Blood is mixed in with stool and on wiping that is bright red to dark red in color. She also reports presence of small clots. No itching. She does not use topical rectal cream or rectal suppository and currently takes aspirin.    No fevers, weight loss, abdominal pain, nausea, vomiting, or changes in BM. She has iron deficiency anemia due to fibroid bleeding and takes oral iron supplements but has not taken it in about a month ago. No lightheadedness, dyspnea, or fatigue.  She has never had a colonoscopy or anoscopy in the past. No FH of GI disease but history of hemorrhoids.       Past Medical History  Diagnosis Date  . Heart murmur   . Anemia   . GERD (gastroesophageal reflux disease)     no meds  . Anxiety    Past Surgical History  Procedure Laterality Date  . Cesarean section      x 5   Family History  Problem Relation Age of Onset  . Heart disease Mother   . Diabetes Father   . Kidney disease Father    History  Substance Use Topics  . Smoking status: Former Smoker    Quit date: 12/28/1997  . Smokeless tobacco: Never Used  . Alcohol Use: No   OB History   Grav Para Term Preterm Abortions TAB SAB Ect Mult Living   5 5        5      Review of Systems  Constitutional: Negative for chills, diaphoresis and  fatigue.  Respiratory: Negative for cough and shortness of breath.   Cardiovascular: Negative for chest pain, palpitations and leg swelling.  Gastrointestinal: Positive for blood in stool and anal bleeding. Negative for nausea, vomiting, abdominal pain, diarrhea, constipation and rectal pain.  Genitourinary: Negative for hematuria, vaginal bleeding and difficulty urinating.  Musculoskeletal: Positive for arthralgias (left hip pain).  Skin: Negative for pallor.  Neurological: Negative for dizziness, weakness, light-headedness and headaches.    Allergies  Review of patient's allergies indicates no known allergies.  Home Medications   Current Outpatient Rx  Name  Route  Sig  Dispense  Refill  . aspirin 325 MG tablet   Oral   Take 162.5 mg by mouth daily as needed.          . Multiple Vitamin (MULTIVITAMIN WITH MINERALS) TABS   Oral   Take 1 tablet by mouth daily.         . Vitamin D, Ergocalciferol, (DRISDOL) 50000 UNITS CAPS   Oral   Take 50,000 Units by mouth every 7 (seven) days.          BP 132/74  Pulse 70  Temp(Src) 98 F (36.7 C) (Oral)  Resp 18  SpO2 99% Physical Exam  Constitutional: She is oriented to person, place, and time. She appears well-developed and well-nourished. No distress.  HENT:  Head: Normocephalic and atraumatic.  Eyes: EOM are normal.  Neck: Normal range of motion. Neck supple.  Cardiovascular: Normal rate and regular rhythm.   Murmur heard. Pulmonary/Chest: Effort normal and breath sounds normal. No respiratory distress. She has no wheezes. She has no rales. She exhibits no tenderness.  Abdominal: Soft. Bowel sounds are normal. She exhibits no distension. There is no tenderness. There is no rebound and no guarding.  Genitourinary:  Anoscopy - Non-inflamed, non bleeding external hemorrhoid  Swollen and distended non-bleeding internal hemorrhoid    Musculoskeletal: Normal range of motion. She exhibits no edema.  Neurological: She is alert  and oriented to person, place, and time.  Skin: Skin is warm and dry. She is not diaphoretic. No pallor.  Psychiatric: She has a normal mood and affect. Her behavior is normal.    ED Course   Procedures (including critical care time)  Labs Reviewed  CBC WITH DIFFERENTIAL - Abnormal; Notable for the following:    Hemoglobin 11.2 (*)    HCT 34.0 (*)    All other components within normal limits  COMPREHENSIVE METABOLIC PANEL - Abnormal; Notable for the following:    Glucose, Bld 112 (*)    Total Bilirubin 0.1 (*)    All other components within normal limits   No results found. 1. 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     Plan:  Painless Rectal Bleeding - Internal/external hemorrhoids vs fissure vs colon polyps vs diverticulosis vs IBD vs colitis vs colon cancer  -Perform anoscopy ---> swollen non-bleeding internal blood vessel, external non-inflamed non-bleeding hemorrhoid -Obtain Cbc w/diff ---> normocytic anemia with Hg/Hct above baseline -Obtain CMP ---> mild hyperglycemia    Disposition: Home ---> Pt hemodynamically stable (normotensive with H/H above baseline) and asymptomatic (denies fatigue, lightheadedness, dyspnea) with recent blood loss. Symptoms most likely due to bleeding internal hemorrhoid due to presence of swollen internal blood vessel on anoscopy and history of painless bleeding. Should see GI for follow-up including possible colonoscopy/sigmoidoscopy to  rule out other etiologies of painless rectal bleeding such as colon polyps, diverticulosis, and malginancy. No history of abdominal pain, wt loss, fever, or change in BM to suggest infectious or inflammatory causes.    Discharge Instructions: -To apply anusol 2.5% rectal cream BID as needed -To insert anusol  suppository (25mg ) BID for 7 days  -To f/u with GI or return to ED if bleeding worsens and becomes symptomatic due to anemia                 Otis Brace, MD 11/01/12 1746

## 2013-05-01 ENCOUNTER — Emergency Department (HOSPITAL_COMMUNITY)
Admission: EM | Admit: 2013-05-01 | Discharge: 2013-05-02 | Disposition: A | Discharge status: Home / Self Care | Payer: No Typology Code available for payment source | Attending: Emergency Medicine | Admitting: Emergency Medicine

## 2013-05-01 ENCOUNTER — Encounter (HOSPITAL_COMMUNITY): Payer: Self-pay | Admitting: Emergency Medicine

## 2013-05-01 DIAGNOSIS — R011 Cardiac murmur, unspecified: Secondary | ICD-10-CM | POA: Insufficient documentation

## 2013-05-01 DIAGNOSIS — Y9389 Activity, other specified: Secondary | ICD-10-CM | POA: Insufficient documentation

## 2013-05-01 DIAGNOSIS — S79929A Unspecified injury of unspecified thigh, initial encounter: Secondary | ICD-10-CM

## 2013-05-01 DIAGNOSIS — D649 Anemia, unspecified: Secondary | ICD-10-CM | POA: Insufficient documentation

## 2013-05-01 DIAGNOSIS — S99929A Unspecified injury of unspecified foot, initial encounter: Secondary | ICD-10-CM

## 2013-05-01 DIAGNOSIS — S8990XA Unspecified injury of unspecified lower leg, initial encounter: Secondary | ICD-10-CM | POA: Insufficient documentation

## 2013-05-01 DIAGNOSIS — IMO0002 Reserved for concepts with insufficient information to code with codable children: Secondary | ICD-10-CM | POA: Insufficient documentation

## 2013-05-01 DIAGNOSIS — S79919A Unspecified injury of unspecified hip, initial encounter: Secondary | ICD-10-CM | POA: Insufficient documentation

## 2013-05-01 DIAGNOSIS — Z8659 Personal history of other mental and behavioral disorders: Secondary | ICD-10-CM | POA: Insufficient documentation

## 2013-05-01 DIAGNOSIS — S3981XA Other specified injuries of abdomen, initial encounter: Secondary | ICD-10-CM | POA: Insufficient documentation

## 2013-05-01 DIAGNOSIS — Y92410 Unspecified street and highway as the place of occurrence of the external cause: Secondary | ICD-10-CM | POA: Insufficient documentation

## 2013-05-01 DIAGNOSIS — Z87891 Personal history of nicotine dependence: Secondary | ICD-10-CM | POA: Insufficient documentation

## 2013-05-01 DIAGNOSIS — S99919A Unspecified injury of unspecified ankle, initial encounter: Secondary | ICD-10-CM

## 2013-05-01 DIAGNOSIS — Z8719 Personal history of other diseases of the digestive system: Secondary | ICD-10-CM | POA: Insufficient documentation

## 2013-05-01 DIAGNOSIS — Z3202 Encounter for pregnancy test, result negative: Secondary | ICD-10-CM | POA: Insufficient documentation

## 2013-05-01 DIAGNOSIS — R110 Nausea: Secondary | ICD-10-CM | POA: Insufficient documentation
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
## 2013-05-02 ENCOUNTER — Emergency Department (HOSPITAL_COMMUNITY): Payer: No Typology Code available for payment source

## 2013-05-02 LAB — POCT I-STAT, CHEM 8
BUN: 11 mg/dL (ref 6–23)
CALCIUM ION: 1.22 mmol/L (ref 1.12–1.23)
CREATININE: 0.8 mg/dL (ref 0.50–1.10)
Chloride: 104 mEq/L (ref 96–112)
Glucose, Bld: 93 mg/dL (ref 70–99)
HCT: 42 % (ref 36.0–46.0)
Hemoglobin: 14.3 g/dL (ref 12.0–15.0)
Potassium: 3.8 mEq/L (ref 3.7–5.3)
Sodium: 142 mEq/L (ref 137–147)
TCO2: 27 mmol/L (ref 0–100)

## 2013-05-02 LAB — POCT PREGNANCY, URINE: Preg Test, Ur: NEGATIVE

## 2013-05-02 MED ORDER — IOHEXOL 300 MG/ML  SOLN
100.0000 mL | Freq: Once | INTRAMUSCULAR | Status: AC | PRN
  Administered 2013-05-02: 100 mL via INTRAVENOUS

## 2013-05-02 MED ORDER — HYDROCODONE-ACETAMINOPHEN 5-325 MG PO TABS: 1.0000 | ORAL_TABLET | Freq: Four times a day (QID) | ORAL | Status: DC | PRN

## 2013-05-02 MED ORDER — DIAZEPAM 5 MG PO TABS: 5.0000 mg | ORAL_TABLET | Freq: Two times a day (BID) | ORAL | Status: DC
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
## 2013-05-02 NOTE — ED Notes (Signed)
MD at bedside. 

## 2013-05-02 NOTE — ED Notes (Signed)
Patient transported to CT 
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
## 2013-05-05 ENCOUNTER — Encounter (HOSPITAL_COMMUNITY): Payer: Self-pay | Admitting: Emergency Medicine

## 2013-05-05 ENCOUNTER — Emergency Department (HOSPITAL_COMMUNITY)
Admission: EM | Admit: 2013-05-05 | Discharge: 2013-05-05 | Disposition: A | Discharge status: Home / Self Care | Payer: No Typology Code available for payment source | Attending: Emergency Medicine | Admitting: Emergency Medicine

## 2013-05-05 DIAGNOSIS — M79603 Pain in arm, unspecified: Secondary | ICD-10-CM

## 2013-05-05 DIAGNOSIS — Y92410 Unspecified street and highway as the place of occurrence of the external cause: Secondary | ICD-10-CM | POA: Insufficient documentation

## 2013-05-05 DIAGNOSIS — F411 Generalized anxiety disorder: Secondary | ICD-10-CM | POA: Insufficient documentation

## 2013-05-05 DIAGNOSIS — R011 Cardiac murmur, unspecified: Secondary | ICD-10-CM | POA: Insufficient documentation

## 2013-05-05 DIAGNOSIS — Z79899 Other long term (current) drug therapy: Secondary | ICD-10-CM | POA: Insufficient documentation

## 2013-05-05 DIAGNOSIS — Y9389 Activity, other specified: Secondary | ICD-10-CM | POA: Insufficient documentation

## 2013-05-05 DIAGNOSIS — M25519 Pain in unspecified shoulder: Secondary | ICD-10-CM | POA: Insufficient documentation

## 2013-05-05 DIAGNOSIS — M79606 Pain in leg, unspecified: Secondary | ICD-10-CM

## 2013-05-05 DIAGNOSIS — S7010XA Contusion of unspecified thigh, initial encounter: Secondary | ICD-10-CM | POA: Insufficient documentation

## 2013-05-05 DIAGNOSIS — Z87891 Personal history of nicotine dependence: Secondary | ICD-10-CM | POA: Insufficient documentation

## 2013-05-05 DIAGNOSIS — M79659 Pain in unspecified thigh: Secondary | ICD-10-CM

## 2013-05-05 DIAGNOSIS — M2569 Stiffness of other specified joint, not elsewhere classified: Secondary | ICD-10-CM | POA: Insufficient documentation

## 2013-05-05 DIAGNOSIS — Z8719 Personal history of other diseases of the digestive system: Secondary | ICD-10-CM | POA: Insufficient documentation

## 2013-05-05 DIAGNOSIS — D649 Anemia, unspecified: Secondary | ICD-10-CM | POA: Insufficient documentation

## 2013-05-05 DIAGNOSIS — M2560 Stiffness of unspecified joint, not elsewhere classified: Secondary | ICD-10-CM

## 2013-05-05 MED ORDER — METHOCARBAMOL 500 MG PO TABS: 500.0000 mg | ORAL_TABLET | Freq: Two times a day (BID) | ORAL | Status: DC | PRN
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
## 2013-05-05 NOTE — Discharge Instructions (Signed)
Take the prescribed medication as directed.  You may take this with your pain medication.  May also wish to apply heat to affected areas to help with bruising. Bruise will likely turn from blue --> purple --> green --> yellow... This is a normal progression of healing. Return to the ED for new or worsening symptoms.

## 2013-05-07 NOTE — ED Provider Notes (Signed)
Medical screening examination/treatment/procedure(s) were performed by non-physician practitioner and as supervising physician I was immediately available for consultation/collaboration.    Kathalene Frames, MD 05/07/13 1225

## 2013-06-17 ENCOUNTER — Encounter (HOSPITAL_COMMUNITY): Payer: Self-pay | Admitting: Emergency Medicine

## 2013-06-17 ENCOUNTER — Emergency Department (HOSPITAL_COMMUNITY): Payer: Medicaid Other

## 2013-06-17 ENCOUNTER — Emergency Department (HOSPITAL_COMMUNITY)
Admission: EM | Admit: 2013-06-17 | Discharge: 2013-06-17 | Disposition: A | Discharge status: Home / Self Care | Payer: Medicaid Other | Attending: Emergency Medicine | Admitting: Emergency Medicine

## 2013-06-17 DIAGNOSIS — F411 Generalized anxiety disorder: Secondary | ICD-10-CM | POA: Insufficient documentation

## 2013-06-17 DIAGNOSIS — R011 Cardiac murmur, unspecified: Secondary | ICD-10-CM | POA: Insufficient documentation

## 2013-06-17 DIAGNOSIS — R059 Cough, unspecified: Secondary | ICD-10-CM

## 2013-06-17 DIAGNOSIS — Z8719 Personal history of other diseases of the digestive system: Secondary | ICD-10-CM | POA: Insufficient documentation

## 2013-06-17 DIAGNOSIS — B9789 Other viral agents as the cause of diseases classified elsewhere: Secondary | ICD-10-CM

## 2013-06-17 DIAGNOSIS — Z79899 Other long term (current) drug therapy: Secondary | ICD-10-CM | POA: Insufficient documentation

## 2013-06-17 DIAGNOSIS — Z87891 Personal history of nicotine dependence: Secondary | ICD-10-CM | POA: Insufficient documentation

## 2013-06-17 DIAGNOSIS — R05 Cough: Secondary | ICD-10-CM

## 2013-06-17 DIAGNOSIS — Z862 Personal history of diseases of the blood and blood-forming organs and certain disorders involving the immune mechanism: Secondary | ICD-10-CM | POA: Insufficient documentation

## 2013-06-17 DIAGNOSIS — J069 Acute upper respiratory infection, unspecified: Secondary | ICD-10-CM

## 2013-06-17 MED ORDER — ALBUTEROL SULFATE HFA 108 (90 BASE) MCG/ACT IN AERS
2.0000 | INHALATION_SPRAY | Freq: Once | RESPIRATORY_TRACT | Status: AC
  Administered 2013-06-17: 2 via RESPIRATORY_TRACT
  Filled 2013-06-17: qty 6.7

## 2013-06-17 MED ORDER — BENZONATATE 100 MG PO CAPS: 100.0000 mg | ORAL_CAPSULE | Freq: Three times a day (TID) | ORAL | Status: DC

## 2013-06-17 NOTE — ED Provider Notes (Signed)
Medical screening examination/treatment/procedure(s) were performed by non-physician practitioner and as supervising physician I was immediately available for consultation/collaboration.  Carmin Muskrat, MD 06/17/13 [phone]

## 2013-06-17 NOTE — ED Provider Notes (Signed)
CSN: [id number]     Arrival date & time 06/17/13  1715 History   First MD Initiated Contact with Patient 06/17/13 1932     No chief complaint on file.    (Consider location/radiation/quality/duration/timing/severity/associated sxs/prior Treatment) The history is provided by the patient and medical records. No language interpreter was used.    Audrey Rodriguez is a 43 y.o. female  with a hx of anemia, GERD presents to the Emergency Department complaining of gradual, persistent, progressively worsening cough onset 1 week ago.  Pt reports associated rhinorrhea and mild sore throat.  She also reports mild chest pain with cough only.  Cough is productive of yellowish mucus.  Patient denies taking any over-the-counter treatments. Nothing makes the symptoms better or worse. She reports her cough is worse at night when she is trying to sleep. She also complains of hearing herself "wheeze" but has no Hx of asthma.  Pt denies fever, chills, headache, neck pain, shortness of breath, abdominal pain, nausea, vomiting, diarrhea, weakness, dizziness, syncope.  Pt also reports sick contacts with pneumonia.     Past Medical History  Diagnosis Date  . Heart murmur   . Anemia   . GERD (gastroesophageal reflux disease)     no meds  . Anxiety    Past Surgical History  Procedure Laterality Date  . Cesarean section      x 5   Family History  Problem Relation Age of Onset  . Heart disease Mother   . Diabetes Father   . Kidney disease Father    History  Substance Use Topics  . Smoking status: Former Smoker    Quit date: 12/28/1997  . Smokeless tobacco: Not on file  . Alcohol Use: No   OB History   Grav Para Term Preterm Abortions TAB SAB Ect Mult Living   5 5        5      Review of Systems  Constitutional: Negative for fever, chills, appetite change and fatigue.  HENT: Positive for congestion, postnasal drip, rhinorrhea, sinus pressure and sore throat. Negative for ear discharge, ear pain and  mouth sores.   Eyes: Negative for visual disturbance.  Respiratory: Positive for cough and wheezing. Negative for chest tightness, shortness of breath and stridor.   Cardiovascular: Positive for chest pain (with cough only). Negative for palpitations and leg swelling.  Gastrointestinal: Negative for nausea, vomiting, abdominal pain and diarrhea.  Genitourinary: Negative for dysuria, urgency, frequency and hematuria.  Musculoskeletal: Negative for arthralgias, back pain, myalgias and neck stiffness.  Skin: Negative for rash.  Neurological: Negative for syncope, light-headedness, numbness and headaches.  Hematological: Negative for adenopathy.  Psychiatric/Behavioral: The patient is not nervous/anxious.   All other systems reviewed and are negative.      Allergies  Review of patient's allergies indicates no known allergies.  Home Medications   Current Outpatient Rx  Name  Route  Sig  Dispense  Refill  . diazepam (VALIUM) 5 MG tablet   Oral   Take 1 tablet (5 mg total) by mouth 2 (two) times daily.   10 tablet   0   . ferrous sulfate 325 (65 FE) MG tablet   Oral   Take 325 mg by mouth daily with breakfast.         . guaiFENesin (MUCINEX) 600 MG 12 hr tablet   Oral   Take by mouth 2 (two) times daily as needed for cough.         . Multiple Vitamin (MULTIVITAMIN WITH  MINERALS) TABS   Oral   Take 1 tablet by mouth daily.         . Vitamin D, Ergocalciferol, (DRISDOL) 50000 UNITS CAPS   Oral   Take 50,000 Units by mouth every 7 (seven) days. On Monday         . benzonatate (TESSALON) 100 MG capsule   Oral   Take 1 capsule (100 mg total) by mouth every 8 (eight) hours.   21 capsule   0    BP 124/65  Pulse 80  Temp(Src) 98.1 F (36.7 C) (Oral)  Resp 18  SpO2 100%  LMP 05/22/2013 Physical Exam  Nursing note and vitals reviewed. Constitutional: She is oriented to person, place, and time. She appears well-developed and well-nourished. No distress.  Awake,  alert, nontoxic appearance  HENT:  Head: Normocephalic and atraumatic.  Right Ear: Tympanic membrane, external ear and ear canal normal.  Left Ear: Tympanic membrane, external ear and ear canal normal.  Nose: Mucosal edema and rhinorrhea present. No epistaxis. Right sinus exhibits no maxillary sinus tenderness and no frontal sinus tenderness. Left sinus exhibits no maxillary sinus tenderness and no frontal sinus tenderness.  Mouth/Throat: Uvula is midline, oropharynx is clear and moist and mucous membranes are normal. Mucous membranes are not pale and not cyanotic. No oropharyngeal exudate, posterior oropharyngeal edema, posterior oropharyngeal erythema or tonsillar abscesses.  Eyes: Conjunctivae are normal. Pupils are equal, round, and reactive to light. No scleral icterus.  Neck: Normal range of motion and full passive range of motion without pain. Neck supple.  Cardiovascular: Normal rate, regular rhythm, normal heart sounds and intact distal pulses.   No murmur heard. Regular rate and rhythm  Pulmonary/Chest: Effort normal and breath sounds normal. No accessory muscle usage or stridor. Not tachypneic. No respiratory distress. She has no decreased breath sounds. She has no wheezes. She has no rhonchi. She has no rales. She exhibits bony tenderness.  Clear and equal breath sounds, no wheezes, rhonchi or rales Mild tenderness to the bilateral ribs  Abdominal: Soft. Bowel sounds are normal. She exhibits no mass. There is no tenderness. There is no rebound and no guarding.  Abdomen soft and nontender  Musculoskeletal: Normal range of motion. She exhibits no edema.  Lymphadenopathy:    She has no cervical adenopathy.  Neurological: She is alert and oriented to person, place, and time.  Speech is clear and goal oriented Moves extremities without ataxia  Skin: Skin is warm and dry. No rash noted. She is not diaphoretic. No erythema.  Psychiatric: She has a normal mood and affect.    ED Course   Procedures (including critical care time) Labs Review Labs Reviewed - No data to display Imaging Review Dg Chest 2 View  06/17/2013   CLINICAL DATA:  Cough, shortness of breath  EXAM: CHEST  2 VIEW  COMPARISON:  DG CHEST 2 VIEW dated 09/22/2009  FINDINGS: The heart size and mediastinal contours are within normal limits. Both lungs are clear. The visualized skeletal structures are unremarkable.  IMPRESSION: No active cardiopulmonary disease.   Electronically Signed   By: Conchita Paris M.D.   On: 06/17/2013 20:27     EKG Interpretation None      MDM   Final diagnoses:  Cough  Viral URI with cough   Arnette Norris presents with 1 week of uri symptoms.  Pt CXR negative for acute infiltrate. I personally reviewed the imaging tests through PACS system.  I reviewed available ER/hospitalization records through the EMR.  Patients symptoms are consistent with URI, likely viral etiology. Discussed that antibiotics are not indicated for viral infections. Pt will be discharged with symptomatic treatment.  Verbalizes understanding and is agreeable with plan. Pt is hemodynamically stable & in NAD prior to dc.  It has been determined that no acute conditions requiring further emergency intervention are present at this time. The patient/guardian have been advised of the diagnosis and plan. We have discussed signs and symptoms that warrant return to the ED, such as changes or worsening in symptoms.   Vital signs are stable at discharge.   BP 124/65  Pulse 80  Temp(Src) 98.1 F (36.7 C) (Oral)  Resp 18  SpO2 100%  LMP 05/22/2013  Patient/guardian has voiced understanding and agreed to follow-up with the PCP or specialist.       Abigail Butts, PA-C 06/17/13 2057

## 2013-06-17 NOTE — Discharge Instructions (Signed)
1. Medications: Tessalon Perles, albuterol inhaler, usual home medications 2. Treatment: rest, drink plenty of fluids, Mucinex, or ibuprofen for pain control or fevers 3. Follow Up: Please followup with your primary doctor for discussion of your diagnoses and further evaluation after today's visit; if you do not have a primary care doctor use the resource guide provided to find one;    Read the instructions below on reasons to return to the emergency department and to learn more about your diagnosis.  Use over the counter medications for symptomatic relief as we discussed (musinex as a decongestant, Tylenol for fever/pain, Motrin/Ibuprofen for muscle aches). If prescribed a cough suppressant during your visit, do not operate heavy machinery with in 5 hours of taking this medication. Followup with your primary care doctor in 4 days if your symptoms persist.  Your more than welcome to return to the emergency department if symptoms worsen or become concerning.  Upper Respiratory Infection, Adult  An upper respiratory infection (URI) is also sometimes known as the common cold. Most people improve within 1 week, but symptoms can last up to 2 weeks. A residual cough may last even longer.   URI is most commonly caused by a virus. Viruses are NOT treated with antibiotics. You can easily spread the virus to others by oral contact. This includes kissing, sharing a glass, coughing, or sneezing. Touching your mouth or nose and then touching a surface, which is then touched by another person, can also spread the virus.   TREATMENT  Treatment is directed at relieving symptoms. There is no cure. Antibiotics are not effective, because the infection is caused by a virus, not by bacteria. Treatment may include:  Increased fluid intake. Sports drinks offer valuable electrolytes, sugars, and fluids.  Breathing heated mist or steam (vaporizer or shower).  Eating chicken soup or other clear broths, and maintaining good  nutrition.  Getting plenty of rest.  Using gargles or lozenges for comfort.  Controlling fevers with ibuprofen or acetaminophen as directed by your caregiver.  Increasing usage of your inhaler if you have asthma.  Return to work when your temperature has returned to normal.   SEEK MEDICAL CARE IF:  After the first few days, you feel you are getting worse rather than better.  You develop worsening shortness of breath, or brown or red sputum. These may be signs of pneumonia.  You develop yellow or brown nasal discharge or pain in the face, especially when you bend forward. These may be signs of sinusitis.  You develop a fever, swollen neck glands, pain with swallowing, or Delaney areas in the back of your throat. These may be signs of strep throat.    Emergency Department Resource Guide 1) Find a Doctor and Pay Out of Pocket Although you won't have to find out who is covered by your insurance plan, it is a good idea to ask around and get recommendations. You will then need to call the office and see if the doctor you have chosen will accept you as a new patient and what types of options they offer for patients who are self-pay. Some doctors offer discounts or will set up payment plans for their patients who do not have insurance, but you will need to ask so you aren't surprised when you get to your appointment.  2) Contact Your Local Health Department Not all health departments have doctors that can see patients for sick visits, but many do, so it is worth a call to see if yours does.  If you don't know where your local health department is, you can check in your phone book. The CDC also has a tool to help you locate your state's health department, and many state websites also have listings of all of their local health departments.  3) Find a [facility] If your illness is not likely to be very severe or complicated, you may want to try a walk in clinic. These are popping up all over the country  in pharmacies, drugstores, and shopping centers. They're usually staffed by nurse practitioners or physician assistants that have been trained to treat common illnesses and complaints. They're usually fairly quick and inexpensive. However, if you have serious medical issues or chronic medical problems, these are probably not your best option.  No Primary Care Doctor: - Call Health Connect at  [phone] - they can help you locate a primary care doctor that  accepts your insurance, provides certain services, etc. - Physician Referral Service- [phone]  Chronic Pain Problems: Organization         Address  Phone   Notes  [facility]  [phone] Patients need to be referred by their primary care doctor.   Medication Assistance: Organization         Address  Phone   Notes  [facility] [employer] Medication [facility] [address] [address] [place], [place] [address] [phone] --Must be a resident of [facility] -- Must have NO insurance coverage whatsoever (no Medicaid/ Medicare, etc.) -- The pt. MUST have a primary care doctor that directs their care regularly and follows them in the community   [facility]ist  [phone]   [employer]  [phone]    Agencies that provide inexpensive medical care: Organization         Address  Phone   Notes  [place]  [phone]   [facility]    [phone]   [facility] [place], [place] [address] [phone]   [place] [address], [place] [phone]   Planned Parenthood    [phone]   [facility]    [phone]   [place] and [employer] [address], [place] Phone:  [phone], Fax:  [phone] Hours of Operation:  9 am - 6 pm, M-F.  Also accepts Medicaid/Medicare and self-pay.  [facility] for [place], [address],  [place] Phone: [phone], Fax: [phone]. Hours of Operation:  8:30 am - 5:30 pm, M-F.  Also accepts Medicaid and self-pay.  [facility] [place] [address] [place] Phone: [phone]   [place], [place], [place] [phone], Ext. [phone] Mondays & Thursdays: 7-9 AM.  First 15 patients are seen on a first come, first serve basis.    [place] Providers:  Organization         Address  Phone   Notes  [facility] [address], [place] [phone] Also accepts self-pay patients.  [place], [place]  [phone]   [place], [address], [place] [phone]   [place] [address] [place] [phone]   Lucianne Lei [address], [place]   [phone] Only  accepts [employer] patients after they have their name applied to their card.   Self-Pay (no insurance) in [facility]:  Organization         Address  Phone   Notes  Sickle Cell Patients, [facility] [place] [phone]   [facility] Urgent Care [place] [phone]   [facility] [place]  [place], [place],  [phone]   [facility]/Dr. Osei-Bonsu  [address] [place] or [address], [place] [phone] Phone number for both [place] and [place] locations is the same.  Urgent Medical and [facility] [address], [place] [phone]   [facility] [address] [place] or [address] [phone]   [facility] [address], [place] [phone], phone; [phone], fax Sees patients 1st and 3rd Saturday of every month.  Must not  qualify for public or private insurance (i.e. Medicaid, Medicare, [place] Health Choice, Veterans' Benefits)  Household income should be no more than 200% of the poverty level The clinic cannot treat you if you are pregnant or think you are pregnant  Sexually transmitted diseases are not treated at the clinic.    Dental Care: Organization         Address  Phone  Notes  [facility] Department of [facility] [phone] Accepts children up to age 77 who are enrolled in [place] or [place]; pregnant women with a Medicaid card; and children who have applied for Medicaid or [place] Health Choice, but were declined, whose parents can pay a reduced fee at time of service.  [facility] Department of [facility]  [address], [place] [phone] Accepts children up to age 38 who are enrolled in [place] or [place]; pregnant women with a Medicaid card; and children who have applied for Medicaid or [place] Health Choice, but were declined, whose parents can pay a reduced fee at time of service.  [place] Adult Dental Access PROGRAM  [place] [phone] Patients are seen by appointment only. Walk-ins are not accepted. [place] will see patients 54 years of age and older. Monday - Tuesday (8am-5pm) Most Wednesdays (8:30-5pm) $30 per visit, cash only  [facility] Adult Dental Access PROGRAM  [address], [facility] [phone] Patients are seen by appointment only. Walk-ins are not accepted. [place] will see patients 82 years of age and older. One Wednesday Evening (Monthly: Volunteer Based).  $30 per visit, cash only  [place]  [phone] for adults; Children under age 44, call Graduate Pediatric Dentistry at [phone]. Children aged 63-14, please call [phone] to request a pediatric application.  Dental services are provided  in all areas of dental care including fillings, crowns and bridges, complete and partial dentures, implants, gum treatment, root canals, and extractions. Preventive care is also provided. Treatment is provided to both adults and children. Patients are selected via a lottery and there is often a waiting list.   [facility] [address] [place]  [phone] [email or web address], [place], [place] [phone], Ext. [phone] Second and Fourth Thursday of each month, opens at 6:30 AM; Clinic ends  at 9 AM.  Patients are seen on a first-come first-served basis, and a limited number are seen during each clinic.   [facility]  [address] Hillard Danker [place], [place] [phone]   Eligibility Requirements You must have lived in [place], [place], or [place] counties for at least the last three months.   You cannot be eligible for state or federal sponsored [employer], including [employer], [place], or [employer].   You generally cannot be eligible for healthcare insurance through your employer.    How to apply: Eligibility screenings are held every Tuesday and Wednesday afternoon from 1:00 pm until 4:00 pm. You do not need an appointment for the interview!  [facility] [address] [place], [place]   [place]  [place] Department  [place]  [phone]    Behavioral Health Resources in the Community: Intensive Outpatient Programs Organization         Address  Phone  Notes  [place]. [address] [place], [place] [phone]   [facility] [address] [place], [place]   ADS: Alcohol & Drug Svcs [address] [place], [place]   [place] [address],  [place], [place] or [phone]   Substance Abuse Resources Organization         Address  Phone  Notes  Alcohol and Drug Services  [phone]   [place]  [phone]   [facility] [place]   Chinita Pester  [phone]   Residential & Outpatient Substance Abuse Program  [phone]   Psychological Services Organization         Address  Phone  Notes  [facility] [place]  [place]  [phone]   [place] [address] [place] or [phone]    Mobile Crisis Teams Organization         Address  Phone  Notes  [facility], Mobile Crisis Care Unit  [phone]   Assertive Psychotherapeutic Services  [address] [place], [place]   [place] [address], [place] [phone]    Self-Help/Support Groups Organization         Address  Phone             Notes  [place]. of [place] - variety of support groups  [place] Call for more information  Narcotics Anonymous (NA), [facility] [address], [employer] [place]  2 meetings at this location   Special educational needs teacher         Address  Phone  Notes  [facility] [place],    [place]  [phone]   [facility]  [address] [place] [id number], [place], [place]   [place], [place] [phone] Admissions: 8am-3pm M-F  Incentives Substance [place] [address],    [place], [place] [phone]   [facility] [address] Jadene Pierini [place], [place]   [facility] [address]  [place], [place]   [facility] - Intensive Outpatient [address] Kristeen Mans 56, [place], [place]   [facility] ([place] [address] Madelaine Bhat  [place], [place] or  [phone]   Residential  Treatment Services (RTS) [address], [place], [place] Accepts Medicaid  [facility] [address]  [place] [phone] Substance Abuse/Addiction Treatment   [facility] Organization         Address  Phone  Notes  [facility]  [phone]   Domenic Schwab, PhD [address] Arlis [place], [place]   [phone] or [phone]   [place], [place] [phone]   [place] [address], [place], [place] [phone] Insurance/Medicaid/sponsorship through [facility] and Families [address], [address]                                    [place], [place] [phone] [place] [address], [place] [phone]    Dr. Adele Schilder  [phone]   [facility] Dept. 1) [address], [place] 2) [place] 3)  [place] [address], [place] [phone]  [phone]   [place] [phone] or [phone] (After Hours)

## 2013-06-20 ENCOUNTER — Telehealth (HOSPITAL_COMMUNITY): Payer: Self-pay

## 2013-10-02 ENCOUNTER — Encounter (HOSPITAL_COMMUNITY): Payer: Self-pay | Admitting: Emergency Medicine

## 2013-10-02 ENCOUNTER — Emergency Department (HOSPITAL_COMMUNITY)
Admission: EM | Admit: 2013-10-02 | Discharge: 2013-10-02 | Disposition: A | Discharge status: Home / Self Care | Payer: Medicaid Other | Attending: Emergency Medicine | Admitting: Emergency Medicine

## 2013-10-02 ENCOUNTER — Emergency Department (HOSPITAL_COMMUNITY): Payer: Medicaid Other

## 2013-10-02 DIAGNOSIS — D649 Anemia, unspecified: Secondary | ICD-10-CM | POA: Insufficient documentation

## 2013-10-02 DIAGNOSIS — R011 Cardiac murmur, unspecified: Secondary | ICD-10-CM | POA: Insufficient documentation

## 2013-10-02 DIAGNOSIS — M79609 Pain in unspecified limb: Secondary | ICD-10-CM | POA: Insufficient documentation

## 2013-10-02 DIAGNOSIS — Z8659 Personal history of other mental and behavioral disorders: Secondary | ICD-10-CM | POA: Insufficient documentation

## 2013-10-02 DIAGNOSIS — Z79899 Other long term (current) drug therapy: Secondary | ICD-10-CM | POA: Insufficient documentation

## 2013-10-02 DIAGNOSIS — M79601 Pain in right arm: Secondary | ICD-10-CM

## 2013-10-02 DIAGNOSIS — Z8719 Personal history of other diseases of the digestive system: Secondary | ICD-10-CM | POA: Insufficient documentation

## 2013-10-02 DIAGNOSIS — R42 Dizziness and giddiness: Secondary | ICD-10-CM | POA: Insufficient documentation

## 2013-10-02 DIAGNOSIS — Z87891 Personal history of nicotine dependence: Secondary | ICD-10-CM | POA: Insufficient documentation

## 2013-10-02 LAB — I-STAT TROPONIN, ED
TROPONIN I, POC: 0 ng/mL (ref 0.00–0.08)
TROPONIN I, POC: 0 ng/mL (ref 0.00–0.08)

## 2013-10-02 LAB — CBC
HEMATOCRIT: 36.7 % (ref 36.0–46.0)
Hemoglobin: 11.8 g/dL — ABNORMAL LOW (ref 12.0–15.0)
MCH: 26.7 pg (ref 26.0–34.0)
MCHC: 32.2 g/dL (ref 30.0–36.0)
MCV: 83 fL (ref 78.0–100.0)
Platelets: 333 10*3/uL (ref 150–400)
RBC: 4.42 MIL/uL (ref 3.87–5.11)
RDW: 13.8 % (ref 11.5–15.5)
WBC: 5.1 10*3/uL (ref 4.0–10.5)

## 2013-10-02 LAB — BASIC METABOLIC PANEL
BUN: 8 mg/dL (ref 6–23)
CALCIUM: 9.4 mg/dL (ref 8.4–10.5)
CO2: 23 mEq/L (ref 19–32)
Chloride: 102 mEq/L (ref 96–112)
Creatinine, Ser: 0.64 mg/dL (ref 0.50–1.10)
GFR calc Af Amer: >90 mL/min (ref >90)
GFR calc non Af Amer: >90 mL/min (ref >90)
Glucose, Bld: 98 mg/dL (ref 70–99)
Potassium: 4.2 mEq/L (ref 3.7–5.3)
Sodium: 141 mEq/L (ref 137–147)

## 2013-10-02 MED ORDER — ASPIRIN 81 MG PO CHEW
243.0000 mg | CHEWABLE_TABLET | Freq: Once | ORAL | Status: AC
  Administered 2013-10-02: 243 mg via ORAL
  Filled 2013-10-02: qty 3

## 2013-10-02 MED ORDER — ONDANSETRON HCL 4 MG/2ML IJ SOLN
4.0000 mg | Freq: Once | INTRAMUSCULAR | Status: DC
  Filled 2013-10-02: qty 2

## 2013-10-02 MED ORDER — MORPHINE SULFATE 4 MG/ML IJ SOLN
4.0000 mg | Freq: Once | INTRAMUSCULAR | Status: DC
  Filled 2013-10-02: qty 1

## 2013-10-02 NOTE — ED Notes (Addendum)
Pt reports sudden onset of left arm and shoulder pain today around 1100, denies injury to arm. Also reports feeling slightly dizzy. Denies pain increasing with movement.

## 2013-10-02 NOTE — ED Provider Notes (Signed)
CSN: [id number]     Arrival date & time 10/02/13  1053 History   First MD Initiated Contact with Patient 10/02/13 1501     Chief Complaint  Patient presents with  . Arm Pain     (Consider location/radiation/quality/duration/timing/severity/associated sxs/prior Treatment) HPI Comments: Patient is a 43 year old female with history of anemia, GERD, anxiety who presents today with left arm pain. She reports that this began around 11 AM when she was folding laundry. She denies any injury to the area. It is a dull pain. It is not worse with palpation or movement. She denies any chest pain or shortness of breath. No nausea, vomiting, diaphoresis. She reports that she did feel very anxious after this began. She had some associated lightheadedness which she states she gets with her anxiety. She has family history of early heart disease with her mother having her first MI in her 29s. She saw a cardiologist 15 years ago and was told that everything was normal. She does not smoke cigarettes.   Patient is a 43 y.o. female presenting with arm pain. The history is provided by the patient. No language interpreter was used.  Arm Pain Associated symptoms include myalgias. Pertinent negatives include no abdominal pain, chest pain, chills, diaphoresis, fever, nausea or vomiting.    Past Medical History  Diagnosis Date  . Heart murmur   . Anemia   . GERD (gastroesophageal reflux disease)     no meds  . Anxiety    Past Surgical History  Procedure Laterality Date  . Cesarean section      x 5   Family History  Problem Relation Age of Onset  . Heart disease Mother   . Diabetes Father   . Kidney disease Father    History  Substance Use Topics  . Smoking status: Former Smoker    Quit date: 12/28/1997  . Smokeless tobacco: Not on file  . Alcohol Use: No   OB History   Grav Para Term Preterm Abortions TAB SAB Ect Mult Living   5 5        5      Review of Systems  Constitutional: Negative for  fever, chills and diaphoresis.  Respiratory: Negative for shortness of breath.   Cardiovascular: Negative for chest pain.  Gastrointestinal: Negative for nausea, vomiting and abdominal pain.  Musculoskeletal: Positive for myalgias.  Neurological: Positive for light-headedness.  All other systems reviewed and are negative.     Allergies  Review of patient's allergies indicates no known allergies.  Home Medications   Prior to Admission medications   Medication Sig Start Date End Date Taking? Authorizing Provider  aspirin 81 MG tablet Take 81 mg by mouth daily.   Yes Historical Provider, MD  ferrous sulfate 325 (65 FE) MG tablet Take 325 mg by mouth daily as needed (for iron).    Yes Historical Provider, MD   BP 126/75  Pulse 62  Temp(Src) 98 F (36.7 C) (Oral)  Resp 18  Wt 155 lb (70.308 kg)  SpO2 100%  LMP 09/25/2013 Physical Exam  Nursing note and vitals reviewed. Constitutional: She is oriented to person, place, and time. She appears well-developed and well-nourished. She does not appear ill. No distress.  Patient appears comfortable.   HENT:  Head: Normocephalic and atraumatic.  Right Ear: External ear normal.  Left Ear: External ear normal.  Nose: Nose normal.  Mouth/Throat: Oropharynx is clear and moist.  Eyes: Conjunctivae are normal.  Neck: Normal range of motion.  Cardiovascular: Normal rate, regular  rhythm, normal heart sounds, intact distal pulses and normal pulses.   Pulses:      Radial pulses are 2+ on the right side, and 2+ on the left side.       Posterior tibial pulses are 2+ on the right side, and 2+ on the left side.  Cap refill < 3 seconds in all fingers.   Pulmonary/Chest: Effort normal and breath sounds normal. No stridor. No respiratory distress. She has no wheezes. She has no rales.  Abdominal: Soft. She exhibits no distension.  Musculoskeletal: Normal range of motion.  Full ROM of left arm. No tenderness to palpation. Compartment soft,  neurovascularly intact. No erythema, streaking, fluctuance, induration.   Neurological: She is alert and oriented to person, place, and time. She has normal strength.  Skin: Skin is warm and dry. She is not diaphoretic. No erythema.  Psychiatric: She has a normal mood and affect. Her behavior is normal.    ED Course  Procedures (including critical care time) Labs Review Labs Reviewed  CBC - Abnormal; Notable for the following:    Hemoglobin 11.8 (*)    All other components within normal limits  BASIC METABOLIC PANEL  I-STAT TROPOININ, ED  Randolm Idol, ED    Imaging Review Dg Chest 2 View  10/02/2013   CLINICAL DATA:  Arm pain  EXAM: CHEST  2 VIEW  COMPARISON:  06/17/2013  FINDINGS: Lateral view degraded by patient arm position. Minimal convex right thoracic spine curvature. Midline trachea. Normal heart size and mediastinal contours. No pleural effusion or pneumothorax. Clear lungs.  IMPRESSION: No acute cardiopulmonary disease.   Electronically Signed   By: Abigail Miyamoto M.D.   On: 10/02/2013 11:57     EKG Interpretation   Date/Time:  Monday October 02 2013 11:29:35 EDT Ventricular Rate:  62 PR Interval:  138 QRS Duration: 80 QT Interval:  414 QTC Calculation: 420 R Axis:   39 Text Interpretation:  Normal sinus rhythm Low voltage QRS Borderline ECG  No significant change was found Confirmed by [facility]  MD, TREY ([id number]) on  10/02/2013 3:44:20 PM      MDM   Final diagnoses:  Pain of right upper extremity   Patient presents to the emergency department with pain in her left arm. This is not reproducible with palpation or movement. She had no associated chest pain or shortness of breath. She has a heart score of 1, putting her in the low risk category for MACE. She has positive family history of her mother having an MI in her 70s. This upper extremity pain would be very atypical to be cardiac. Troponin negative x2. Patient was given outpatient cardiology referral as I believe  she will benefit from an outpatient stress test given this family history. Discussed reasons to return to the emergency Department immediately including chest pain, shortness of breath, pain with exertion, diaphoresis. Discussed case with Dr. Leonides Schanz who agrees with plan. Vital signs stable for discharge. Patient / Family / Caregiver informed of clinical course, understand medical decision-making process, and agree with plan.    Elwyn Lade, PA-C 10/04/13 0045

## 2013-10-02 NOTE — Discharge Instructions (Signed)

## 2013-10-04 NOTE — ED Provider Notes (Signed)
Medical screening examination/treatment/procedure(s) were performed by non-physician practitioner and as supervising physician I was immediately available for consultation/collaboration.   EKG Interpretation   Date/Time:  Monday October 02 2013 11:29:35 EDT Ventricular Rate:  62 PR Interval:  138 QRS Duration: 80 QT Interval:  414 QTC Calculation: 420 R Axis:   39 Text Interpretation:  Normal sinus rhythm Low voltage QRS Borderline ECG  No significant change was found Confirmed by [facility]  MD, TREY ([id number]) on  10/02/2013 3:44:20 PM        [place], DO 10/04/13 1450

## 2013-11-22 ENCOUNTER — Ambulatory Visit (INDEPENDENT_AMBULATORY_CARE_PROVIDER_SITE_OTHER): Payer: Medicaid Other | Admitting: Cardiology

## 2013-11-22 ENCOUNTER — Encounter: Payer: Self-pay | Admitting: Cardiology

## 2013-11-22 VITALS — BP 136/72 | HR 62 | Ht 63.0 in | Wt 153.0 lb

## 2013-11-22 DIAGNOSIS — M79602 Pain in left arm: Secondary | ICD-10-CM | POA: Insufficient documentation

## 2013-11-22 DIAGNOSIS — D509 Iron deficiency anemia, unspecified: Secondary | ICD-10-CM

## 2013-11-22 DIAGNOSIS — R011 Cardiac murmur, unspecified: Secondary | ICD-10-CM

## 2013-11-22 DIAGNOSIS — M79609 Pain in unspecified limb: Secondary | ICD-10-CM

## 2013-11-22 LAB — CBC WITH DIFFERENTIAL/PLATELET
BASOS ABS: 0 10*3/uL (ref 0.0–0.1)
BASOS PCT: 0.4 % (ref 0.0–3.0)
EOS ABS: 0.1 10*3/uL (ref 0.0–0.7)
Eosinophils Relative: 1.2 % (ref 0.0–5.0)
HEMATOCRIT: 33.9 % — AB (ref 36.0–46.0)
HEMOGLOBIN: 11.1 g/dL — AB (ref 12.0–15.0)
LYMPHS PCT: 36.7 % (ref 12.0–46.0)
Lymphs Abs: 1.9 10*3/uL (ref 0.7–4.0)
MCHC: 32.5 g/dL (ref 30.0–36.0)
MCV: 83.2 fl (ref 78.0–100.0)
Monocytes Absolute: 0.4 10*3/uL (ref 0.1–1.0)
Monocytes Relative: 8 % (ref 3.0–12.0)
Neutro Abs: 2.7 10*3/uL (ref 1.4–7.7)
Neutrophils Relative %: 53.7 % (ref 43.0–77.0)
Platelets: 348 10*3/uL (ref 150.0–400.0)
RBC: 4.08 Mil/uL (ref 3.87–5.11)
RDW: 14.3 % (ref 11.5–15.5)
WBC: 5.1 10*3/uL (ref 4.0–10.5)

## 2013-11-22 NOTE — Patient Instructions (Addendum)
Your physician recommends that you return for lab work in: [place] W/DIFF  Your physician recommends that you schedule a follow-up appointment in: [place] physician recommends that you continue on your current medications as directed. Please refer to the Current Medication list given to you today.

## 2013-11-22 NOTE — Progress Notes (Signed)
Audrey Rodriguez Date of Birth:  03/20/1971 [facility] [address] [place], [place]  [phone] [phone]        Fax   [phone]   History of Present Illness: This pleasant 43 year old African American woman is seen at the request of her primary care physician at [facility].  She is being seen because of a prior episode of left arm discomfort.  She was seen in the [facility] emergency room on 10/02/13 for evaluation of left arm pain.  The pain had started while she was folding laundry.  There was no associated shortness of breath or diaphoresis.  She did have some lightheadedness which she attributed to anxiety.  In the emergency room she had a CBC which showed mild anemia with hemoglobin of 11.8 and she had an EKG which showed low voltage but no ischemic changes.  She had negative troponins x2.  She has had no subsequent left arm pain.  She feels that it was in retrospect secondary to a muscle strain.  At no time has she been experiencing any chest discomfort.  The patient is not diabetic.  She denies any history of high blood pressure or hypercholesterolemia.  Normally she exercises at [employer].  She has not been experiencing any exertional chest or left arm symptoms.  Her weight has been stable. Social history reveals that she is married.  She has 5 children.  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 The patient has a history of iron deficiency anemia secondary to menometrorrhagia from fibroids.  She is taking oral iron. Her family history reveals that her mother had a heart attack in her 25s but is alive at age 72. Her father died of complications of diabetes.   Current Outpatient Prescriptions  Medication Sig Dispense Refill  . aspirin 81 MG tablet Take 81 mg by mouth daily.      . ferrous sulfate 325 (65 FE) MG tablet Take 325 mg by mouth daily as needed (for iron).        No current facility-administered medications for this  visit.    No Known Allergies  Patient Active Problem List   Diagnosis Date Noted  . Left arm pain 11/22/2013  . Iron deficiency anemia 11/22/2013  . Heart murmur previously undiagnosed 11/22/2013  . Fibroids 02/24/2012    History  Smoking status  . Former Smoker  . Quit date: 12/28/1997  Smokeless tobacco  . Not on file    History  Alcohol Use No    Family History  Problem Relation Age of Onset  . Heart disease Mother   . Diabetes Father   . Kidney disease Father     Review of Systems: Constitutional: no fever chills diaphoresis or fatigue or change in weight.  Head and neck: no hearing loss, no epistaxis, no photophobia or visual disturbance. Respiratory: No cough, shortness of breath or wheezing. Cardiovascular: No chest pain peripheral edema, palpitations. Gastrointestinal: No abdominal distention, no abdominal pain, no change in bowel habits hematochezia or melena. Genitourinary: No dysuria, no frequency, no urgency, no nocturia. Musculoskeletal:No arthralgias, no back pain, no gait disturbance or myalgias. Neurological: No dizziness, no headaches, no numbness, no seizures, no syncope, no weakness, no tremors. Hematologic: No lymphadenopathy, no easy bruising. Psychiatric: No confusion, no hallucinations, no sleep disturbance.    Physical Exam: Filed Vitals:   11/22/13 1418  BP: 136/72  Pulse: 62   the general appearance reveals  a well-developed well-nourished woman in no distress.The head and neck exam reveals pupils equal and reactive.  Extraocular movements are full.  There is no scleral icterus.  The mouth and pharynx are normal.  The neck is supple.  The carotids reveal no bruits.  The jugular venous pressure is normal.  The  thyroid is not enlarged.  There is no lymphadenopathy.  The chest is clear to percussion and auscultation.  There are no rales or rhonchi.  Expansion of the chest is symmetrical.  The precordium is quiet.  The first heart sound is  normal.  The second heart sound is physiologically split.  There is a grade 2/6 musical systolic ejection murmur at the left sternal edge and base.  The murmur is louder supine than upright.  There is no abnormal lift or heave.  The abdomen is soft and nontender.  The bowel sounds are normal.  The liver and spleen are not enlarged.  There are no abdominal masses.  There are no abdominal bruits.  Extremities reveal good pedal pulses.  There is no phlebitis or edema.  There is no cyanosis or clubbing.  Strength is normal and symmetrical in all extremities.  There is no lateralizing weakness.  There are no sensory deficits.  The skin is warm and dry.  There is no rash.  EKG today shows normal sinus rhythm and is within normal limits.  No ischemic changes.   Assessment / Plan: 1. left arm pain, without recurrence, felt to have represented musculoskeletal pain rather than ischemic heart pain. 2. iron deficiency anemia 3. heart murmur  Disposition: We will recheck her CBC today to be sure that her hemoglobin is stable or improving on oral iron. She will return for an echocardiogram to evaluate her systolic murmur.  We will also look for left ventricular function and wall motion abnormalities.  If her echocardiogram is satisfactory she will not need any further cardiac testing at this point.  We would want to see her back if she has any recurrent problems with left arm discomfort or if she begins to have any chest discomfort.  Many thanks for the opportunity to see this pleasant woman.

## 2013-11-24 ENCOUNTER — Ambulatory Visit (HOSPITAL_COMMUNITY): Payer: Medicaid Other | Attending: Cardiology | Admitting: Cardiology

## 2013-11-24 DIAGNOSIS — R011 Cardiac murmur, unspecified: Secondary | ICD-10-CM | POA: Insufficient documentation

## 2013-11-24 DIAGNOSIS — M79602 Pain in left arm: Secondary | ICD-10-CM

## 2013-11-24 DIAGNOSIS — D509 Iron deficiency anemia, unspecified: Secondary | ICD-10-CM

## 2013-11-24 NOTE — Progress Notes (Signed)
Echo performed. 

## 2013-11-27 ENCOUNTER — Telehealth: Payer: Self-pay | Admitting: Cardiology

## 2013-11-27 NOTE — Telephone Encounter (Signed)
Advised patient

## 2013-11-27 NOTE — Telephone Encounter (Signed)
Message copied by Earvin Hansen on Mon Nov 27, 2013  5:34 PM ------      Message from: Darlin Coco      Created: Sat Nov 25, 2013  6:08 PM       Please report.  The echo was normal. The murmur was probably secondary to her anemia. CSD ------

## 2013-11-27 NOTE — Telephone Encounter (Signed)
°  Patient is calling for test results, please call and advise.

## 2013-11-27 NOTE — Telephone Encounter (Signed)
Follow up ° ° ° ° °Returning a nurses call °

## 2013-11-28 ENCOUNTER — Other Ambulatory Visit: Payer: Self-pay

## 2013-11-28 DIAGNOSIS — Z1231 Encounter for screening mammogram for malignant neoplasm of breast: Secondary | ICD-10-CM

## 2013-12-06 ENCOUNTER — Ambulatory Visit
Admission: RE | Admit: 2013-12-06 | Discharge: 2013-12-06 | Disposition: A | Discharge status: Home / Self Care | Payer: Medicaid Other | Source: Ambulatory Visit

## 2013-12-06 DIAGNOSIS — Z1231 Encounter for screening mammogram for malignant neoplasm of breast: Secondary | ICD-10-CM

## 2014-02-05 ENCOUNTER — Encounter: Payer: Self-pay | Admitting: Cardiology

## 2014-06-18 ENCOUNTER — Encounter (HOSPITAL_COMMUNITY): Payer: Self-pay | Admitting: Emergency Medicine

## 2014-06-18 ENCOUNTER — Emergency Department (HOSPITAL_COMMUNITY)
Admission: EM | Admit: 2014-06-18 | Discharge: 2014-06-18 | Discharge status: Against Medical Advice | Payer: Medicaid Other | Attending: Emergency Medicine | Admitting: Emergency Medicine

## 2014-06-18 DIAGNOSIS — L02413 Cutaneous abscess of right upper limb: Secondary | ICD-10-CM | POA: Insufficient documentation

## 2014-06-18 DIAGNOSIS — R011 Cardiac murmur, unspecified: Secondary | ICD-10-CM | POA: Insufficient documentation

## 2014-06-18 NOTE — ED Notes (Signed)
Pt reports boil under R armpit. Denies drainage, fevers/chills.

## 2014-07-06 ENCOUNTER — Other Ambulatory Visit: Payer: Self-pay | Admitting: Internal Medicine

## 2014-07-06 DIAGNOSIS — N61 Mastitis without abscess: Secondary | ICD-10-CM

## 2014-07-06 DIAGNOSIS — N63 Unspecified lump in unspecified breast: Secondary | ICD-10-CM

## 2014-07-11 ENCOUNTER — Other Ambulatory Visit: Payer: Self-pay | Admitting: Internal Medicine

## 2014-07-11 DIAGNOSIS — N61 Mastitis without abscess: Secondary | ICD-10-CM

## 2014-07-11 DIAGNOSIS — N63 Unspecified lump in unspecified breast: Secondary | ICD-10-CM

## 2014-07-12 ENCOUNTER — Other Ambulatory Visit: Payer: Self-pay | Admitting: Internal Medicine

## 2014-07-16 ENCOUNTER — Ambulatory Visit
Admission: RE | Admit: 2014-07-16 | Discharge: 2014-07-16 | Disposition: A | Discharge status: Home / Self Care | Payer: Self-pay | Source: Ambulatory Visit | Attending: Internal Medicine | Admitting: Internal Medicine

## 2014-07-16 ENCOUNTER — Other Ambulatory Visit: Payer: Self-pay | Admitting: Internal Medicine

## 2014-07-16 DIAGNOSIS — N61 Mastitis without abscess: Secondary | ICD-10-CM

## 2014-07-16 DIAGNOSIS — N63 Unspecified lump in unspecified breast: Secondary | ICD-10-CM

## 2014-07-20 ENCOUNTER — Encounter (HOSPITAL_COMMUNITY): Payer: Self-pay | Admitting: Emergency Medicine

## 2014-07-20 ENCOUNTER — Emergency Department (HOSPITAL_COMMUNITY)
Admission: EM | Admit: 2014-07-20 | Discharge: 2014-07-20 | Disposition: A | Discharge status: Home / Self Care | Payer: No Typology Code available for payment source | Attending: Emergency Medicine | Admitting: Emergency Medicine

## 2014-07-20 DIAGNOSIS — Z8719 Personal history of other diseases of the digestive system: Secondary | ICD-10-CM | POA: Insufficient documentation

## 2014-07-20 DIAGNOSIS — S3992XA Unspecified injury of lower back, initial encounter: Secondary | ICD-10-CM | POA: Insufficient documentation

## 2014-07-20 DIAGNOSIS — Y9389 Activity, other specified: Secondary | ICD-10-CM | POA: Diagnosis not present

## 2014-07-20 DIAGNOSIS — Z79899 Other long term (current) drug therapy: Secondary | ICD-10-CM | POA: Diagnosis not present

## 2014-07-20 DIAGNOSIS — Y92410 Unspecified street and highway as the place of occurrence of the external cause: Secondary | ICD-10-CM | POA: Diagnosis not present

## 2014-07-20 DIAGNOSIS — R011 Cardiac murmur, unspecified: Secondary | ICD-10-CM | POA: Diagnosis not present

## 2014-07-20 DIAGNOSIS — Z8659 Personal history of other mental and behavioral disorders: Secondary | ICD-10-CM | POA: Insufficient documentation

## 2014-07-20 DIAGNOSIS — D649 Anemia, unspecified: Secondary | ICD-10-CM | POA: Insufficient documentation

## 2014-07-20 DIAGNOSIS — Y998 Other external cause status: Secondary | ICD-10-CM | POA: Insufficient documentation

## 2014-07-20 DIAGNOSIS — Z87891 Personal history of nicotine dependence: Secondary | ICD-10-CM | POA: Diagnosis not present

## 2014-07-20 MED ORDER — NAPROXEN 500 MG PO TABS: 500.0000 mg | ORAL_TABLET | Freq: Two times a day (BID) | ORAL | Status: DC

## 2014-07-20 MED ORDER — CYCLOBENZAPRINE HCL 5 MG PO TABS: 5.0000 mg | ORAL_TABLET | Freq: Three times a day (TID) | ORAL | Status: DC | PRN
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
## 2014-10-15 ENCOUNTER — Encounter (HOSPITAL_COMMUNITY): Payer: Self-pay | Admitting: Emergency Medicine

## 2014-10-15 ENCOUNTER — Emergency Department (HOSPITAL_COMMUNITY)
Admission: EM | Admit: 2014-10-15 | Discharge: 2014-10-15 | Discharge status: Against Medical Advice | Payer: Medicaid Other | Attending: Emergency Medicine | Admitting: Emergency Medicine

## 2014-10-15 DIAGNOSIS — S199XXA Unspecified injury of neck, initial encounter: Secondary | ICD-10-CM | POA: Diagnosis not present

## 2014-10-15 DIAGNOSIS — Y9389 Activity, other specified: Secondary | ICD-10-CM | POA: Insufficient documentation

## 2014-10-15 DIAGNOSIS — S4992XA Unspecified injury of left shoulder and upper arm, initial encounter: Secondary | ICD-10-CM | POA: Diagnosis not present

## 2014-10-15 DIAGNOSIS — Y92410 Unspecified street and highway as the place of occurrence of the external cause: Secondary | ICD-10-CM | POA: Insufficient documentation

## 2014-10-15 DIAGNOSIS — Y998 Other external cause status: Secondary | ICD-10-CM | POA: Insufficient documentation

## 2014-10-15 DIAGNOSIS — S3992XA Unspecified injury of lower back, initial encounter: Secondary | ICD-10-CM | POA: Insufficient documentation
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
## 2014-10-16 ENCOUNTER — Emergency Department (HOSPITAL_COMMUNITY)
Admission: EM | Admit: 2014-10-16 | Discharge: 2014-10-16 | Disposition: A | Discharge status: Home / Self Care | Payer: Medicaid Other | Attending: Emergency Medicine | Admitting: Emergency Medicine

## 2014-10-16 ENCOUNTER — Encounter (HOSPITAL_COMMUNITY): Payer: Self-pay | Admitting: Emergency Medicine

## 2014-10-16 DIAGNOSIS — S161XXA Strain of muscle, fascia and tendon at neck level, initial encounter: Secondary | ICD-10-CM | POA: Diagnosis not present

## 2014-10-16 DIAGNOSIS — Z791 Long term (current) use of non-steroidal anti-inflammatories (NSAID): Secondary | ICD-10-CM | POA: Diagnosis not present

## 2014-10-16 DIAGNOSIS — F419 Anxiety disorder, unspecified: Secondary | ICD-10-CM | POA: Diagnosis not present

## 2014-10-16 DIAGNOSIS — S3992XA Unspecified injury of lower back, initial encounter: Secondary | ICD-10-CM | POA: Diagnosis not present

## 2014-10-16 DIAGNOSIS — Y939 Activity, unspecified: Secondary | ICD-10-CM | POA: Insufficient documentation

## 2014-10-16 DIAGNOSIS — Y999 Unspecified external cause status: Secondary | ICD-10-CM | POA: Insufficient documentation

## 2014-10-16 DIAGNOSIS — E780 Pure hypercholesterolemia: Secondary | ICD-10-CM | POA: Diagnosis not present

## 2014-10-16 DIAGNOSIS — Z8719 Personal history of other diseases of the digestive system: Secondary | ICD-10-CM | POA: Diagnosis not present

## 2014-10-16 DIAGNOSIS — Y92410 Unspecified street and highway as the place of occurrence of the external cause: Secondary | ICD-10-CM | POA: Insufficient documentation

## 2014-10-16 DIAGNOSIS — R011 Cardiac murmur, unspecified: Secondary | ICD-10-CM | POA: Diagnosis not present

## 2014-10-16 DIAGNOSIS — Z79899 Other long term (current) drug therapy: Secondary | ICD-10-CM | POA: Insufficient documentation

## 2014-10-16 DIAGNOSIS — Z87891 Personal history of nicotine dependence: Secondary | ICD-10-CM | POA: Diagnosis not present

## 2014-10-16 DIAGNOSIS — S199XXA Unspecified injury of neck, initial encounter: Secondary | ICD-10-CM | POA: Diagnosis present

## 2014-10-16 DIAGNOSIS — Z862 Personal history of diseases of the blood and blood-forming organs and certain disorders involving the immune mechanism: Secondary | ICD-10-CM | POA: Insufficient documentation

## 2014-10-16 MED ORDER — CYCLOBENZAPRINE HCL 10 MG PO TABS: 10.0000 mg | ORAL_TABLET | Freq: Two times a day (BID) | ORAL | Status: DC | PRN

## 2014-10-16 MED ORDER — IBUPROFEN 800 MG PO TABS: 800.0000 mg | ORAL_TABLET | Freq: Three times a day (TID) | ORAL | Status: DC

## 2014-10-16 MED ORDER — CYCLOBENZAPRINE HCL 10 MG PO TABS
10.0000 mg | ORAL_TABLET | Freq: Two times a day (BID) | ORAL | Status: DC | PRN
Start: 2014-10-16 — End: 2014-10-16
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
## 2014-10-16 NOTE — Discharge Instructions (Signed)
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
## 2014-11-20 ENCOUNTER — Other Ambulatory Visit: Payer: Self-pay

## 2014-11-20 ENCOUNTER — Other Ambulatory Visit: Payer: Self-pay | Admitting: Internal Medicine

## 2014-11-20 DIAGNOSIS — N6310 Unspecified lump in the right breast, unspecified quadrant: Secondary | ICD-10-CM

## 2014-11-20 DIAGNOSIS — Z1231 Encounter for screening mammogram for malignant neoplasm of breast: Secondary | ICD-10-CM

## 2014-11-23 ENCOUNTER — Encounter: Payer: Self-pay | Admitting: Podiatry

## 2014-11-23 ENCOUNTER — Ambulatory Visit (INDEPENDENT_AMBULATORY_CARE_PROVIDER_SITE_OTHER): Payer: Medicaid Other

## 2014-11-23 ENCOUNTER — Ambulatory Visit (INDEPENDENT_AMBULATORY_CARE_PROVIDER_SITE_OTHER): Payer: Medicaid Other | Admitting: Podiatry

## 2014-11-23 VITALS — BP 122/77 | HR 74 | Resp 18

## 2014-11-23 DIAGNOSIS — M7662 Achilles tendinitis, left leg: Secondary | ICD-10-CM | POA: Diagnosis not present

## 2014-11-23 DIAGNOSIS — M7671 Peroneal tendinitis, right leg: Secondary | ICD-10-CM

## 2014-11-23 DIAGNOSIS — R52 Pain, unspecified: Secondary | ICD-10-CM

## 2014-11-23 DIAGNOSIS — M67471 Ganglion, right ankle and foot: Secondary | ICD-10-CM | POA: Diagnosis not present

## 2014-11-23 DIAGNOSIS — M7672 Peroneal tendinitis, left leg: Secondary | ICD-10-CM

## 2014-11-23 MED ORDER — MELOXICAM 7.5 MG PO TABS: 7.5000 mg | ORAL_TABLET | Freq: Every day | ORAL | Status: DC

## 2014-11-23 NOTE — Progress Notes (Signed)
   Subjective:    Patient ID: Audrey Rodriguez, female    DOB: 02-11-1971, 44 y.o.   MRN: [id number]  HPI  44 year old female presents the office today for 2 complaints. For she states that she has a knot on the top of her right foot which has been ongoing for the last several months. She states the area does not hurt and she has not noticed any change. She denies any numbness or tingling. There is been no swelling or redness. She has no pain with ambulation or with shoe gear. She also states that she has pain in the back of her left heel mostly after working and standing for periods of time. She denies any history of injury or trauma. She denies any swelling or redness. No tingling or numbness. The pain does not wake up at night. She's had no treatment for either of these issues. No other complaints this time.   Review of Systems  All other systems reviewed and are negative.      Objective:   Physical Exam AAO x3, NAD DP/PT pulses palpable bilaterally, CRT less than 3 seconds Protective sensation intact with Simms Weinstein monofilament, vibratory sensation intact, Achilles tendon reflex intact On the dorsal aspect of the right midfoot there appears to be a small exostosis off the midfoot with overlying fluid-filled well encapsulated soft tissue mass consistent with a ganglion cyst. There is no tenderness to palpation overlying the area and there is no overlying edema, erythema, increase in warmth. There is no open lesions or skin changes. On the posterior aspect of the left heel on the distal portion of the Achilles tendon on the insertion into the calcaneus there is mild tenderness. There is no pain along the mid substance of the Achilles tendon her proximally. There is no defect noted. Thompson test is negative. Ankle joint range of motion is intact. There is mild equinus present. No other areas of tenderness to bilateral lower extremities. MMT 5/5, ROM WNL.  No open lesions or pre-ulcerative  lesions.  No overlying edema, erythema, increase in warmth to bilateral lower extremities.  No pain with calf compression, swelling, warmth, erythema bilaterally.         Assessment & Plan:  -Treatment options discussed including all alternatives, risks, and complications -X-rays were obtained and reviewed with the patient.   1. Ganglion cyst right foot -I discussed both conservative and surgical treatment options. At this time as the area is not painful she will continue to observe the area. I discussed with her that if it becomes painful or increases in size to call the office.  2. Achilles tendonitis/retrocalcaneal exostosis left heel -Discussed likely etiology of her symptoms. -Stretching exercises consistently and daily. -Heel lift was dispensed to wear as needed. -Discussed orthotics and shoegear changes   -Follow-up 4-6 weeks or sooner if any problems arise. In the meantime, encouraged to call the office with any questions, concerns, change in symptoms.   Celesta Gentile, DPM

## 2014-11-23 NOTE — Patient Instructions (Signed)
Achilles Tendinitis   with Rehab  Achilles tendinitis is a disorder of the Achilles tendon. The Achilles tendon connects the large calf muscles (Gastrocnemius and Soleus) to the heel bone (calcaneus). This tendon is sometimes called the heel cord. It is important for pushing-off and standing on your toes and is important for walking, running, or jumping. Tendinitis is often caused by overuse and repetitive microtrauma.  SYMPTOMS  · Pain, tenderness, swelling, warmth, and redness may occur over the Achilles tendon even at rest.  · Pain with pushing off, or flexing or extending the ankle.  · Pain that is worsened after or during activity.  CAUSES   · Overuse sometimes seen with rapid increase in exercise programs or in sports requiring running and jumping.  · Poor physical conditioning (strength and flexibility or endurance).  · Running sports, especially training running down hills.  · Inadequate warm-up before practice or play or failure to stretch before participation.  · Injury to the tendon.  PREVENTION   · Warm up and stretch before practice or competition.  · Allow time for adequate rest and recovery between practices and competition.  · Keep up conditioning.  ¨ Keep up ankle and leg flexibility.  ¨ Improve or keep muscle strength and endurance.  ¨ Improve cardiovascular fitness.  · Use proper technique.  · Use proper equipment (shoes, skates).  · To help prevent recurrence, taping, protective strapping, or an adhesive bandage may be recommended for several weeks after healing is complete.  PROGNOSIS   · Recovery may take weeks to several months to heal.  · Longer recovery is expected if symptoms have been prolonged.  · Recovery is usually quicker if the inflammation is due to a direct blow as compared with overuse or sudden strain.  RELATED COMPLICATIONS   · Healing time will be prolonged if the condition is not correctly treated. The injury must be given plenty of time to heal.  · Symptoms can reoccur if  activity is resumed too soon.  · Untreated, tendinitis may increase the risk of tendon rupture requiring additional time for recovery and possibly surgery.  TREATMENT   · The first treatment consists of rest anti-inflammatory medication, and ice to relieve the pain.  · Stretching and strengthening exercises after resolution of pain will likely help reduce the risk of recurrence. Referral to a physical therapist or athletic trainer for further evaluation and treatment may be helpful.  · A walking boot or cast may be recommended to rest the Achilles tendon. This can help break the cycle of inflammation and microtrauma.  · Arch supports (orthotics) may be prescribed or recommended by your caregiver as an adjunct to therapy and rest.  · Surgery to remove the inflamed tendon lining or degenerated tendon tissue is rarely necessary and has shown less than predictable results.  MEDICATION   · Nonsteroidal anti-inflammatory medications, such as aspirin and ibuprofen, may be used for pain and inflammation relief. Do not take within 7 days before surgery. Take these as directed by your caregiver. Contact your caregiver immediately if any bleeding, stomach upset, or signs of allergic reaction occur. Other minor pain relievers, such as acetaminophen, may also be used.  · Pain relievers may be prescribed as necessary by your caregiver. Do not take prescription pain medication for longer than 4 to 7 days. Use only as directed and only as much as you need.  · Cortisone injections are rarely indicated. Cortisone injections may weaken tendons and predispose to rupture. It is better   to give the condition more time to heal than to use them.  HEAT AND COLD  · Cold is used to relieve pain and reduce inflammation for acute and chronic Achilles tendinitis. Cold should be applied for 10 to 15 minutes every 2 to 3 hours for inflammation and pain and immediately after any activity that aggravates your symptoms. Use ice packs or an ice  massage.  · Heat may be used before performing stretching and strengthening activities prescribed by your caregiver. Use a heat pack or a warm soak.  SEEK MEDICAL CARE IF:  · Symptoms get worse or do not improve in 2 weeks despite treatment.  · New, unexplained symptoms develop. Drugs used in treatment may produce side effects.  EXERCISES  RANGE OF MOTION (ROM) AND STRETCHING EXERCISES - Achilles Tendinitis   These exercises may help you when beginning to rehabilitate your injury. Your symptoms may resolve with or without further involvement from your physician, physical therapist or athletic trainer. While completing these exercises, remember:   · Restoring tissue flexibility helps normal motion to return to the joints. This allows healthier, less painful movement and activity.  · An effective stretch should be held for at least 30 seconds.  · A stretch should never be painful. You should only feel a gentle lengthening or release in the stretched tissue.  STRETCH - Gastroc, Standing   · Place hands on wall.  · Extend right / left leg, keeping the front knee somewhat bent.  · Slightly point your toes inward on your back foot.  · Keeping your right / left heel on the floor and your knee straight, shift your weight toward the wall, not allowing your back to arch.  · You should feel a gentle stretch in the right / left calf. Hold this position for __________ seconds.  Repeat __________ times. Complete this stretch __________ times per day.  STRETCH - Soleus, Standing   · Place hands on wall.  · Extend right / left leg, keeping the other knee somewhat bent.  · Slightly point your toes inward on your back foot.  · Keep your right / left heel on the floor, bend your back knee, and slightly shift your weight over the back leg so that you feel a gentle stretch deep in your back calf.  · Hold this position for __________ seconds.  Repeat __________ times. Complete this stretch __________ times per day.  STRETCH -  Gastrocsoleus, Standing   Note: This exercise can place a lot of stress on your foot and ankle. Please complete this exercise only if specifically instructed by your caregiver.   · Place the ball of your right / left foot on a step, keeping your other foot firmly on the same step.  · Hold on to the wall or a rail for balance.  · Slowly lift your other foot, allowing your body weight to press your heel down over the edge of the step.  · You should feel a stretch in your right / left calf.  · Hold this position for __________ seconds.  · Repeat this exercise with a slight bend in your knee.  Repeat __________ times. Complete this stretch __________ times per day.   STRENGTHENING EXERCISES - Achilles Tendinitis  These exercises may help you when beginning to rehabilitate your injury. They may resolve your symptoms with or without further involvement from your physician, physical therapist or athletic trainer. While completing these exercises, remember:   · Muscles can gain both the endurance   and the strength needed for everyday activities through controlled exercises.  · Complete these exercises as instructed by your physician, physical therapist or athletic trainer. Progress the resistance and repetitions only as guided.  · You may experience muscle soreness or fatigue, but the pain or discomfort you are trying to eliminate should never worsen during these exercises. If this pain does worsen, stop and make certain you are following the directions exactly. If the pain is still present after adjustments, discontinue the exercise until you can discuss the trouble with your clinician.  STRENGTH - Plantar-flexors   · Sit with your right / left leg extended. Holding onto both ends of a rubber exercise band/tubing, loop it around the ball of your foot. Keep a slight tension in the band.  · Slowly push your toes away from you, pointing them downward.  · Hold this position for __________ seconds. Return slowly, controlling the  tension in the band/tubing.  Repeat __________ times. Complete this exercise __________ times per day.   STRENGTH - Plantar-flexors   · Stand with your feet shoulder width apart. Steady yourself with a wall or table using as little support as needed.  · Keeping your weight evenly spread over the width of your feet, rise up on your toes.*  · Hold this position for __________ seconds.  Repeat __________ times. Complete this exercise __________ times per day.   *If this is too easy, shift your weight toward your right / left leg until you feel challenged. Ultimately, you may be asked to do this exercise with your right / left foot only.  STRENGTH - Plantar-flexors, Eccentric   Note: This exercise can place a lot of stress on your foot and ankle. Please complete this exercise only if specifically instructed by your caregiver.   · Place the balls of your feet on a step. With your hands, use only enough support from a wall or rail to keep your balance.  · Keep your knees straight and rise up on your toes.  · Slowly shift your weight entirely to your right / left toes and pick up your opposite foot. Gently and with controlled movement, lower your weight through your right / left foot so that your heel drops below the level of the step. You will feel a slight stretch in the back of your calf at the end position.  · Use the healthy leg to help rise up onto the balls of both feet, then lower weight only on the right / left leg again. Build up to 15 repetitions. Then progress to 3 consecutive sets of 15 repetitions.*  · After completing the above exercise, complete the same exercise with a slight knee bend (about 30 degrees). Again, build up to 15 repetitions. Then progress to 3 consecutive sets of 15 repetitions.*  Perform this exercise __________ times per day.   *When you easily complete 3 sets of 15, your physician, physical therapist or athletic trainer may advise you to add resistance by wearing a backpack filled with  additional weight.  STRENGTH - Plantar Flexors, Seated   · Sit on a chair that allows your feet to rest flat on the ground. If necessary, sit at the edge of the chair.  · Keeping your toes firmly on the ground, lift your right / left heel as far as you can without increasing any discomfort in your ankle.  Repeat __________ times. Complete this exercise __________ times a day.  *If instructed by your physician, physical therapist or athletic   trainer, you may add ____________________ of resistance by placing a weighted object on your right / left knee.  Document Released: 10/22/2004 Document Revised: 06/15/2011 Document Reviewed: 07/05/2008  [employer]® Patient Information ©2015 [employer]. This information is not intended to replace advice given to you by your health care provider. Make sure you discuss any questions you have with your health care provider.

## 2014-11-26 DIAGNOSIS — M7662 Achilles tendinitis, left leg: Secondary | ICD-10-CM | POA: Insufficient documentation

## 2014-11-26 DIAGNOSIS — M67471 Ganglion, right ankle and foot: Secondary | ICD-10-CM | POA: Insufficient documentation

## 2014-12-21 ENCOUNTER — Ambulatory Visit (INDEPENDENT_AMBULATORY_CARE_PROVIDER_SITE_OTHER): Payer: Medicaid Other | Admitting: Podiatry

## 2014-12-21 ENCOUNTER — Encounter: Payer: Self-pay | Admitting: Podiatry

## 2014-12-21 VITALS — BP 121/76 | HR 89 | Resp 18

## 2014-12-21 DIAGNOSIS — M7662 Achilles tendinitis, left leg: Secondary | ICD-10-CM | POA: Diagnosis not present

## 2014-12-21 DIAGNOSIS — M67471 Ganglion, right ankle and foot: Secondary | ICD-10-CM | POA: Diagnosis not present

## 2014-12-21 MED ORDER — MELOXICAM 7.5 MG PO TABS: 7.5000 mg | ORAL_TABLET | Freq: Every day | ORAL | Status: DC

## 2014-12-21 NOTE — Patient Instructions (Signed)
You can purchase a night splint at the drug store  Achilles Tendinitis  with Rehab Achilles tendinitis is a disorder of the Achilles tendon. The Achilles tendon connects the large calf muscles (Gastrocnemius and Soleus) to the heel bone (calcaneus). This tendon is sometimes called the heel cord. It is important for pushing-off and standing on your toes and is important for walking, running, or jumping. Tendinitis is often caused by overuse and repetitive microtrauma. SYMPTOMS  Pain, tenderness, swelling, warmth, and redness may occur over the Achilles tendon even at rest.  Pain with pushing off, or flexing or extending the ankle.  Pain that is worsened after or during activity. CAUSES   Overuse sometimes seen with rapid increase in exercise programs or in sports requiring running and jumping.  Poor physical conditioning (strength and flexibility or endurance).  Running sports, especially training running down hills.  Inadequate warm-up before practice or play or failure to stretch before participation.  Injury to the tendon. PREVENTION   Warm up and stretch before practice or competition.  Allow time for adequate rest and recovery between practices and competition.  Keep up conditioning.  Keep up ankle and leg flexibility.  Improve or keep muscle strength and endurance.  Improve cardiovascular fitness.  Use proper technique.  Use proper equipment (shoes, skates).  To help prevent recurrence, taping, protective strapping, or an adhesive bandage may be recommended for several weeks after healing is complete. PROGNOSIS   Recovery may take weeks to several months to heal.  Longer recovery is expected if symptoms have been prolonged.  Recovery is usually quicker if the inflammation is due to a direct blow as compared with overuse or sudden strain. RELATED COMPLICATIONS   Healing time will be prolonged if the condition is not correctly treated. The injury must be given  plenty of time to heal.  Symptoms can reoccur if activity is resumed too soon.  Untreated, tendinitis may increase the risk of tendon rupture requiring additional time for recovery and possibly surgery. TREATMENT   The first treatment consists of rest anti-inflammatory medication, and ice to relieve the pain.  Stretching and strengthening exercises after resolution of pain will likely help reduce the risk of recurrence. Referral to a physical therapist or athletic trainer for further evaluation and treatment may be helpful.  A walking boot or cast may be recommended to rest the Achilles tendon. This can help break the cycle of inflammation and microtrauma.  Arch supports (orthotics) may be prescribed or recommended by your caregiver as an adjunct to therapy and rest.  Surgery to remove the inflamed tendon lining or degenerated tendon tissue is rarely necessary and has shown less than predictable results. MEDICATION   Nonsteroidal anti-inflammatory medications, such as aspirin and ibuprofen, may be used for pain and inflammation relief. Do not take within 7 days before surgery. Take these as directed by your caregiver. Contact your caregiver immediately if any bleeding, stomach upset, or signs of allergic reaction occur. Other minor pain relievers, such as acetaminophen, may also be used.  Pain relievers may be prescribed as necessary by your caregiver. Do not take prescription pain medication for longer than 4 to 7 days. Use only as directed and only as much as you need.  Cortisone injections are rarely indicated. Cortisone injections may weaken tendons and predispose to rupture. It is better to give the condition more time to heal than to use them. HEAT AND COLD  Cold is used to relieve pain and reduce inflammation for acute and chronic  Achilles tendinitis. Cold should be applied for 10 to 15 minutes every 2 to 3 hours for inflammation and pain and immediately after any activity that  aggravates your symptoms. Use ice packs or an ice massage.  Heat may be used before performing stretching and strengthening activities prescribed by your caregiver. Use a heat pack or a warm soak. SEEK MEDICAL CARE IF:  Symptoms get worse or do not improve in 2 weeks despite treatment.  New, unexplained symptoms develop. Drugs used in treatment may produce side effects. EXERCISES RANGE OF MOTION (ROM) AND STRETCHING EXERCISES - Achilles Tendinitis  These exercises may help you when beginning to rehabilitate your injury. Your symptoms may resolve with or without further involvement from your physician, physical therapist or athletic trainer. While completing these exercises, remember:   Restoring tissue flexibility helps normal motion to return to the joints. This allows healthier, less painful movement and activity.  An effective stretch should be held for at least 30 seconds.  A stretch should never be painful. You should only feel a gentle lengthening or release in the stretched tissue. STRETCH - Gastroc, Standing   Place hands on wall.  Extend right / left leg, keeping the front knee somewhat bent.  Slightly point your toes inward on your back foot.  Keeping your right / left heel on the floor and your knee straight, shift your weight toward the wall, not allowing your back to arch.  You should feel a gentle stretch in the right / left calf. Hold this position for __________ seconds. Repeat __________ times. Complete this stretch __________ times per day. STRETCH - Soleus, Standing   Place hands on wall.  Extend right / left leg, keeping the other knee somewhat bent.  Slightly point your toes inward on your back foot.  Keep your right / left heel on the floor, bend your back knee, and slightly shift your weight over the back leg so that you feel a gentle stretch deep in your back calf.  Hold this position for __________ seconds. Repeat __________ times. Complete this  stretch __________ times per day. STRETCH - Gastrocsoleus, Standing  Note: This exercise can place a lot of stress on your foot and ankle. Please complete this exercise only if specifically instructed by your caregiver.   Place the ball of your right / left foot on a step, keeping your other foot firmly on the same step.  Hold on to the wall or a rail for balance.  Slowly lift your other foot, allowing your body weight to press your heel down over the edge of the step.  You should feel a stretch in your right / left calf.  Hold this position for __________ seconds.  Repeat this exercise with a slight bend in your knee. Repeat __________ times. Complete this stretch __________ times per day.  STRENGTHENING EXERCISES - Achilles Tendinitis These exercises may help you when beginning to rehabilitate your injury. They may resolve your symptoms with or without further involvement from your physician, physical therapist or athletic trainer. While completing these exercises, remember:   Muscles can gain both the endurance and the strength needed for everyday activities through controlled exercises.  Complete these exercises as instructed by your physician, physical therapist or athletic trainer. Progress the resistance and repetitions only as guided.  You may experience muscle soreness or fatigue, but the pain or discomfort you are trying to eliminate should never worsen during these exercises. If this pain does worsen, stop and make certain you are  following the directions exactly. If the pain is still present after adjustments, discontinue the exercise until you can discuss the trouble with your clinician. STRENGTH - Plantar-flexors   Sit with your right / left leg extended. Holding onto both ends of a rubber exercise band/tubing, loop it around the ball of your foot. Keep a slight tension in the band.  Slowly push your toes away from you, pointing them downward.  Hold this position for  __________ seconds. Return slowly, controlling the tension in the band/tubing. Repeat __________ times. Complete this exercise __________ times per day.  STRENGTH - Plantar-flexors   Stand with your feet shoulder width apart. Steady yourself with a wall or table using as little support as needed.  Keeping your weight evenly spread over the width of your feet, rise up on your toes.*  Hold this position for __________ seconds. Repeat __________ times. Complete this exercise __________ times per day.  *If this is too easy, shift your weight toward your right / left leg until you feel challenged. Ultimately, you may be asked to do this exercise with your right / left foot only. STRENGTH - Plantar-flexors, Eccentric  Note: This exercise can place a lot of stress on your foot and ankle. Please complete this exercise only if specifically instructed by your caregiver.   Place the balls of your feet on a step. With your hands, use only enough support from a wall or rail to keep your balance.  Keep your knees straight and rise up on your toes.  Slowly shift your weight entirely to your right / left toes and pick up your opposite foot. Gently and with controlled movement, lower your weight through your right / left foot so that your heel drops below the level of the step. You will feel a slight stretch in the back of your calf at the end position.  Use the healthy leg to help rise up onto the balls of both feet, then lower weight only on the right / left leg again. Build up to 15 repetitions. Then progress to 3 consecutive sets of 15 repetitions.*  After completing the above exercise, complete the same exercise with a slight knee bend (about 30 degrees). Again, build up to 15 repetitions. Then progress to 3 consecutive sets of 15 repetitions.* Perform this exercise __________ times per day.  *When you easily complete 3 sets of 15, your physician, physical therapist or athletic trainer may advise you to  add resistance by wearing a backpack filled with additional weight. STRENGTH - Plantar Flexors, Seated   Sit on a chair that allows your feet to rest flat on the ground. If necessary, sit at the edge of the chair.  Keeping your toes firmly on the ground, lift your right / left heel as far as you can without increasing any discomfort in your ankle. Repeat __________ times. Complete this exercise __________ times a day. *If instructed by your physician, physical therapist or athletic trainer, you may add ____________________ of resistance by placing a weighted object on your right / left knee. Document Released: 10/22/2004 Document Revised: 06/15/2011 Document Reviewed: 07/05/2008 [facility] Patient Information 2015 [place], [place]. This information is not intended to replace advice given to you by your health care Senaya Dicenso. Make sure you discuss any questions you have with your health care Asalee Barrette.

## 2014-12-27 NOTE — Progress Notes (Signed)
Patient ID: Audrey Rodriguez, female   DOB: 1971-02-12, 44 y.o.   MRN: [id number]  Subjective: 44 year old female presents the office they for follow-up evaluation of left heel pain. She says that she continues to have pain pain has not changed his last appointment. She has not been stretching. She ices intermittently. She wears the heel lifts intermittently. She does continue with regular shoe gear. Denies any swelling or redness. She has a states the soft tissue mass in the right foot is the same without any pain or cause any problems. No other complaints at this time. No acute changes.  Objective: AAO 3, NAD  DP/PT pulses palpable, CRT less than 3 seconds Protective sensation intact with Simms Weinstein monofilament There is tenderness of the posterior aspect the posterior portion of the left heel and along the distal portion of the Achilles tendon on the insertion into the calcaneus. There is no pain along the mid substance of the Achilles tendon and no defect is noted. Thompson test is negative. No pain along with lateral compression of the calcaneus. No pain along the course of plantar fascia. On the dorsal aspect of the right midfoot there is a small exostosis of the small fluid-filled well and Soft Tissue Mass Adjacent Appears to Be Unchanged Last Appointment. There Is No Tenderness Palpation Overlying the Area. No skin Change. No open lesions or pre-ulcerative lesions identified bilateral.  No pain with calf compression, swelling, warmth, erythema.  Assessment: 44 year old female continued Achilles tendinitis left side; right ganglion cyst/exostosis  Plan: -Treatment options discussed including all alternatives, risks, and complications -I recommended the patient to continue stretching on a consistent basis. She has not been doing this. Also ice to the area daily. I also discussed night splint for which she will purchase. Prescribed mobic. Discussed side effects of the medication and directed to  stop if any are to occur and call the office.  Continue with supportive shoe gear and heel lift as needed. -Continue to monitor right ganglion cyst/exostosis. Currently a symptomatic. -Follow-up in 4 weeks or sooner if any problems arise. In the meantime, encouraged to call the office with any questions, concerns, change in symptoms.   Celesta Gentile, DPM

## 2015-01-16 ENCOUNTER — Other Ambulatory Visit: Payer: Self-pay | Admitting: Internal Medicine

## 2015-01-16 DIAGNOSIS — N63 Unspecified lump in unspecified breast: Secondary | ICD-10-CM

## 2015-01-18 ENCOUNTER — Inpatient Hospital Stay: Admission: RE | Admit: 2015-01-18 | Payer: Medicaid Other | Source: Ambulatory Visit

## 2015-01-22 ENCOUNTER — Ambulatory Visit: Payer: Medicaid Other | Admitting: Podiatry

## 2015-01-25 ENCOUNTER — Ambulatory Visit: Payer: Medicaid Other | Admitting: Podiatry

## 2015-02-04 ENCOUNTER — Encounter: Payer: Self-pay | Admitting: Podiatry

## 2015-02-21 ENCOUNTER — Emergency Department (HOSPITAL_COMMUNITY)
Admission: EM | Admit: 2015-02-21 | Discharge: 2015-02-21 | Disposition: A | Discharge status: Home / Self Care | Payer: Medicaid Other | Source: Home / Self Care | Attending: Family Medicine | Admitting: Family Medicine

## 2015-02-21 ENCOUNTER — Encounter (HOSPITAL_COMMUNITY): Payer: Self-pay | Admitting: Emergency Medicine

## 2015-02-21 DIAGNOSIS — R030 Elevated blood-pressure reading, without diagnosis of hypertension: Secondary | ICD-10-CM | POA: Diagnosis not present

## 2015-02-21 DIAGNOSIS — R42 Dizziness and giddiness: Secondary | ICD-10-CM

## 2015-02-21 MED ORDER — MECLIZINE HCL 25 MG PO TABS: ORAL_TABLET | ORAL | Status: DC

## 2015-02-21 NOTE — ED Provider Notes (Signed)
CSN: [id number]     Arrival date & time 02/21/15  1855 History   First MD Initiated Contact with Patient 02/21/15 1938     Chief Complaint  Patient presents with  . Hypertension   (Consider location/radiation/quality/duration/timing/severity/associated sxs/prior Treatment) HPI Comments: 44 year old female states that around 10:00 this morning she ate pork ribs. Shortly afterward she began to have some dizziness off and on. She also had a mild headache behind the right eye radiating to the right occiput. She states that that headache has improved significantly. She took her blood pressure and it was elevated at home. This frightened her and she rushed to the urgent care. She denies neck pain, cough, sore throat, earache, fever, chills, chest pain, shortness of breath, abdominal pain, nausea vomiting or focal paresthesias, weakness, problems with vision, speech or hearing.   Past Medical History  Diagnosis Date  . Heart murmur   . Anemia   . GERD (gastroesophageal reflux disease)     no meds  . Anxiety    Past Surgical History  Procedure Laterality Date  . Cesarean section      x 5   Family History  Problem Relation Age of Onset  . Heart disease Mother   . Diabetes Father   . Kidney disease Father    Social History  Substance Use Topics  . Smoking status: Former Smoker    Quit date: 12/28/1997  . Smokeless tobacco: None  . Alcohol Use: No   OB History    Gravida Para Term Preterm AB TAB SAB Ectopic Multiple Living   5 5        5      Review of Systems  Constitutional: Negative for fever, activity change and fatigue.  HENT: Negative for congestion, ear discharge, ear pain, hearing loss, postnasal drip, rhinorrhea, sinus pressure, sore throat and trouble swallowing.   Eyes: Negative for photophobia and visual disturbance.  Respiratory: Negative for cough and shortness of breath.   Cardiovascular: Negative for chest pain, palpitations and leg swelling.  Gastrointestinal:  Negative.   Genitourinary: Negative.   Musculoskeletal: Negative.   Skin: Negative.   Neurological: Positive for dizziness and headaches. Negative for tremors, syncope, facial asymmetry, speech difficulty, weakness, light-headedness and numbness.  Psychiatric/Behavioral: Negative.     Allergies  Review of patient's allergies indicates no known allergies.  Home Medications   Prior to Admission medications   Medication Sig Start Date End Date Taking? Authorizing Provider  cyclobenzaprine (FLEXERIL) 10 MG tablet Take 1 tablet (10 mg total) by mouth 2 (two) times daily as needed for muscle spasms. 10/16/14   Gareth Morgan, MD  ergocalciferol (VITAMIN D2) 50000 UNITS capsule Take 50,000 Units by mouth once a week. Tuesdays.    Historical Provider, MD  ferrous sulfate 325 (65 FE) MG tablet Take 325 mg by mouth daily as needed (for iron).     Historical Provider, MD  ibuprofen (ADVIL,MOTRIN) 800 MG tablet Take 1 tablet (800 mg total) by mouth 3 (three) times daily. 10/16/14   Gareth Morgan, MD  meclizine (ANTIVERT) 25 MG tablet Take 1/2-1 tablet every 6-8 hours as needed for dizziness 02/21/15   Janne Napoleon, NP  meloxicam (MOBIC) 7.5 MG tablet Take 1 tablet (7.5 mg total) by mouth daily. 12/21/14   Trula Slade, DPM  naproxen (NAPROSYN) 500 MG tablet Take 1 tablet (500 mg total) by mouth 2 (two) times daily. 07/20/14   Margarita Mail, PA-C  simvastatin (ZOCOR) 20 MG tablet Take 20 mg by mouth daily.  Historical Provider, MD   Meds Ordered and Administered this Visit  Medications - No data to display  BP 158/91 mmHg  Pulse 73  Temp(Src) 98.3 F (36.8 C) (Oral)  Resp 16  SpO2 97% No data found.   Physical Exam  Constitutional: She is oriented to person, place, and time. She appears well-developed and well-nourished. No distress.  HENT:  Head: Normocephalic and atraumatic.  Mouth/Throat: Oropharynx is clear and moist. No oropharyngeal exudate.  Bilateral TMs are normal.  Oropharynx is clear, moist, soft palate rises symmetrically. Tongue and uvula midline.  Eyes: Conjunctivae and EOM are normal. Pupils are equal, round, and reactive to light. Right eye exhibits no discharge. Left eye exhibits no discharge.  No nystagmus  Neck: Normal range of motion. Neck supple.  Cardiovascular: Normal rate, regular rhythm, normal heart sounds and intact distal pulses.   Pulmonary/Chest: Effort normal and breath sounds normal. No respiratory distress.  Abdominal: Soft. There is no tenderness.  Musculoskeletal: Normal range of motion. She exhibits no edema or tenderness.  Lymphadenopathy:    She has no cervical adenopathy.  Neurological: She is alert and oriented to person, place, and time. She has normal strength. She displays no tremor. No cranial nerve deficit or sensory deficit. She exhibits normal muscle tone. She displays a negative Romberg sign. Coordination and gait normal.  No dysmetria.  Skin: Skin is warm and dry. No rash noted.  Psychiatric: She has a normal mood and affect.  Nursing note and vitals reviewed.   ED Course  Procedures (including critical care time)  Labs Review Labs Reviewed - No data to display  Imaging Review No results found.   Visual Acuity Review  Right Eye Distance:   Left Eye Distance:   Bilateral Distance:    Right Eye Near:   Left Eye Near:    Bilateral Near:         MDM   1. Dizziness   2. Elevated blood pressure (not hypertension)    Physical exam grossly normal. Neurologic exam is grossly normal. Recommend seeing your primary care doctor for evaluation of elevated blood pressure. For worsening as we discussed that may include severe headache, vomiting, problems with vision, speech, hearing, swallowing, double vision, weakness on one side of the body or any other abnormalities could probably to the emergency department or call EMS. Meclizine 12-1/2-25 mg every 8 hours when necessary dizziness  Janne Napoleon,  NP 02/21/15 2000

## 2015-02-21 NOTE — Discharge Instructions (Signed)
Dizziness Dizziness is a common problem. It is a feeling of unsteadiness or light-headedness. You may feel like you are about to faint. Dizziness can lead to injury if you stumble or fall. Anyone can become dizzy, but dizziness is more common in older adults. This condition can be caused by a number of things, including medicines, dehydration, or illness. HOME CARE INSTRUCTIONS Taking these steps may help with your condition: Eating and Drinking  Drink enough fluid to keep your urine clear or pale yellow. This helps to keep you from becoming dehydrated. Try to drink more clear fluids, such as water.  Do not drink alcohol.  Limit your caffeine intake if directed by your health care provider.  Limit your salt intake if directed by your health care provider. Activity  Avoid making quick movements.  Rise slowly from chairs and steady yourself until you feel okay.  In the morning, first sit up on the side of the bed. When you feel okay, stand slowly while you hold onto something until you know that your balance is fine.  Move your legs often if you need to stand in one place for a long time. Tighten and relax your muscles in your legs while you are standing.  Do not drive or operate heavy machinery if you feel dizzy.  Avoid bending down if you feel dizzy. Place items in your home so that they are easy for you to reach without leaning over. Lifestyle  Do not use any tobacco products, including cigarettes, chewing tobacco, or electronic cigarettes. If you need help quitting, ask your health care provider.  Try to reduce your stress level, such as with yoga or meditation. Talk with your health care provider if you need help. General Instructions  Watch your dizziness for any changes.  Take medicines only as directed by your health care provider. Talk with your health care provider if you think that your dizziness is caused by a medicine that you are taking.  Tell a friend or a family  member that you are feeling dizzy. If he or she notices any changes in your behavior, have this person call your health care provider.  Keep all follow-up visits as directed by your health care provider. This is important. SEEK MEDICAL CARE IF:  Your dizziness does not go away.  Your dizziness or light-headedness gets worse.  You feel nauseous.  You have reduced hearing.  You have new symptoms.  You are unsteady on your feet or you feel like the room is spinning. SEEK IMMEDIATE MEDICAL CARE IF:  You vomit or have diarrhea and are unable to eat or drink anything.  You have problems talking, walking, swallowing, or using your arms, hands, or legs.  You feel generally weak.  You are not thinking clearly or you have trouble forming sentences. It may take a friend or family member to notice this.  You have chest pain, abdominal pain, shortness of breath, or sweating.  Your vision changes.  You notice any bleeding.  You have a headache.  You have neck pain or a stiff neck.  You have a fever.   This information is not intended to replace advice given to you by your health care provider. Make sure you discuss any questions you have with your health care provider.   Document Released: 09/16/2000 Document Revised: 08/07/2014 Document Reviewed: 03/19/2014 Elsevier Interactive Patient Education 2016 [employer].  Hypertension Hypertension, commonly called high blood pressure, is when the force of blood pumping through your arteries is  too strong. Your arteries are the blood vessels that carry blood from your heart throughout your body. A blood pressure reading consists of a higher number over a lower number, such as 110/72. The higher number (systolic) is the pressure inside your arteries when your heart pumps. The lower number (diastolic) is the pressure inside your arteries when your heart relaxes. Ideally you want your blood pressure below 120/80. Hypertension forces your heart  to work harder to pump blood. Your arteries may become narrow or stiff. Having untreated or uncontrolled hypertension can cause heart attack, stroke, kidney disease, and other problems. RISK FACTORS Some risk factors for high blood pressure are controllable. Others are not.  Risk factors you cannot control include:   Race. You may be at higher risk if you are African American.  Age. Risk increases with age.  Gender. Men are at higher risk than women before age 18 years. After age 22, women are at higher risk than men. Risk factors you can control include:  Not getting enough exercise or physical activity.  Being overweight.  Getting too much fat, sugar, calories, or salt in your diet.  Drinking too much alcohol. SIGNS AND SYMPTOMS Hypertension does not usually cause signs or symptoms. Extremely high blood pressure (hypertensive crisis) may cause headache, anxiety, shortness of breath, and nosebleed. DIAGNOSIS To check if you have hypertension, your health care provider will measure your blood pressure while you are seated, with your arm held at the level of your heart. It should be measured at least twice using the same arm. Certain conditions can cause a difference in blood pressure between your right and left arms. A blood pressure reading that is higher than normal on one occasion does not mean that you need treatment. If it is not clear whether you have high blood pressure, you may be asked to return on a different day to have your blood pressure checked again. Or, you may be asked to monitor your blood pressure at home for 1 or more weeks. TREATMENT Treating high blood pressure includes making lifestyle changes and possibly taking medicine. Living a healthy lifestyle can help lower high blood pressure. You may need to change some of your habits. Lifestyle changes may include:  Following the DASH diet. This diet is high in fruits, vegetables, and whole grains. It is low in salt, red  meat, and added sugars.  Keep your sodium intake below 2,300 mg per day.  Getting at least 30-45 minutes of aerobic exercise at least 4 times per week.  Losing weight if necessary.  Not smoking.  Limiting alcoholic beverages.  Learning ways to reduce stress. Your health care provider may prescribe medicine if lifestyle changes are not enough to get your blood pressure under control, and if one of the following is true:  You are 78-68 years of age and your systolic blood pressure is above 140.  You are 43 years of age or older, and your systolic blood pressure is above 150.  Your diastolic blood pressure is above 90.  You have diabetes, and your systolic blood pressure is over [id number] or your diastolic blood pressure is over 90.  You have kidney disease and your blood pressure is above 140/90.  You have heart disease and your blood pressure is above 140/90. Your personal target blood pressure may vary depending on your medical conditions, your age, and other factors. HOME CARE INSTRUCTIONS  Have your blood pressure rechecked as directed by your health care provider.   Take medicines  only as directed by your health care provider. Follow the directions carefully. Blood pressure medicines must be taken as prescribed. The medicine does not work as well when you skip doses. Skipping doses also puts you at risk for problems.  Do not smoke.   Monitor your blood pressure at home as directed by your health care provider. SEEK MEDICAL CARE IF:   You think you are having a reaction to medicines taken.  You have recurrent headaches or feel dizzy.  You have swelling in your ankles.  You have trouble with your vision. SEEK IMMEDIATE MEDICAL CARE IF:  You develop a severe headache or confusion.  You have unusual weakness, numbness, or feel faint.  You have severe chest or abdominal pain.  You vomit repeatedly.  You have trouble breathing. MAKE SURE YOU:   Understand these  instructions.  Will watch your condition.  Will get help right away if you are not doing well or get worse.   This information is not intended to replace advice given to you by your health care provider. Make sure you discuss any questions you have with your health care provider.   Document Released: 03/23/2005 Document Revised: 08/07/2014 Document Reviewed: 01/13/2013 Elsevier Interactive Patient Education [employer].

## 2015-03-08 ENCOUNTER — Ambulatory Visit
Admission: RE | Admit: 2015-03-08 | Discharge: 2015-03-08 | Disposition: A | Discharge status: Home / Self Care | Payer: Medicaid Other | Source: Ambulatory Visit | Attending: Internal Medicine | Admitting: Internal Medicine

## 2015-03-08 DIAGNOSIS — N6310 Unspecified lump in the right breast, unspecified quadrant: Secondary | ICD-10-CM

## 2015-03-08 DIAGNOSIS — N63 Unspecified lump in unspecified breast: Secondary | ICD-10-CM

## 2015-03-12 ENCOUNTER — Emergency Department (HOSPITAL_COMMUNITY)
Admission: EM | Admit: 2015-03-12 | Discharge: 2015-03-12 | Disposition: A | Discharge status: Home / Self Care | Payer: Medicaid Other | Attending: Emergency Medicine | Admitting: Emergency Medicine

## 2015-03-12 ENCOUNTER — Encounter (HOSPITAL_COMMUNITY): Payer: Self-pay | Admitting: Emergency Medicine

## 2015-03-12 ENCOUNTER — Emergency Department (HOSPITAL_COMMUNITY): Payer: Medicaid Other

## 2015-03-12 DIAGNOSIS — Z79899 Other long term (current) drug therapy: Secondary | ICD-10-CM | POA: Insufficient documentation

## 2015-03-12 DIAGNOSIS — E782 Mixed hyperlipidemia: Secondary | ICD-10-CM | POA: Diagnosis not present

## 2015-03-12 DIAGNOSIS — K219 Gastro-esophageal reflux disease without esophagitis: Secondary | ICD-10-CM | POA: Diagnosis not present

## 2015-03-12 DIAGNOSIS — D649 Anemia, unspecified: Secondary | ICD-10-CM | POA: Diagnosis not present

## 2015-03-12 DIAGNOSIS — Z8659 Personal history of other mental and behavioral disorders: Secondary | ICD-10-CM | POA: Insufficient documentation

## 2015-03-12 DIAGNOSIS — R011 Cardiac murmur, unspecified: Secondary | ICD-10-CM | POA: Diagnosis not present

## 2015-03-12 DIAGNOSIS — R079 Chest pain, unspecified: Secondary | ICD-10-CM | POA: Diagnosis not present

## 2015-03-12 HISTORY — DX: Pure hypercholesterolemia, unspecified: E78.00

## 2015-03-12 LAB — CBC
HCT: 35.8 % — ABNORMAL LOW (ref 36.0–46.0)
HEMOGLOBIN: 11.5 g/dL — AB (ref 12.0–15.0)
MCH: 26.9 pg (ref 26.0–34.0)
MCHC: 32.1 g/dL (ref 30.0–36.0)
MCV: 83.6 fL (ref 78.0–100.0)
Platelets: 289 10*3/uL (ref 150–400)
RBC: 4.28 MIL/uL (ref 3.87–5.11)
RDW: 13 % (ref 11.5–15.5)
WBC: 8.7 10*3/uL (ref 4.0–10.5)

## 2015-03-12 LAB — BASIC METABOLIC PANEL
ANION GAP: 7 (ref 5–15)
BUN: 9 mg/dL (ref 6–20)
CALCIUM: 9.2 mg/dL (ref 8.9–10.3)
CO2: 23 mmol/L (ref 22–32)
Chloride: 110 mmol/L (ref 101–111)
Creatinine, Ser: 0.81 mg/dL (ref 0.44–1.00)
GFR calc non Af Amer: >60 mL/min (ref >60)
Glucose, Bld: 131 mg/dL — ABNORMAL HIGH (ref 65–99)
Potassium: 3.6 mmol/L (ref 3.5–5.1)
Sodium: 140 mmol/L (ref 135–145)

## 2015-03-12 LAB — I-STAT TROPONIN, ED: TROPONIN I, POC: 0 ng/mL (ref 0.00–0.08)

## 2015-03-12 MED ORDER — GI COCKTAIL ~~LOC~~
30.0000 mL | Freq: Once | ORAL | Status: AC
  Administered 2015-03-12: 30 mL via ORAL
  Filled 2015-03-12: qty 30

## 2015-03-12 MED ORDER — OMEPRAZOLE 20 MG PO CPDR: 20.0000 mg | DELAYED_RELEASE_CAPSULE | Freq: Every day | ORAL | Status: DC

## 2015-03-12 NOTE — ED Provider Notes (Signed)
CSN: [id number]     Arrival date & time 03/12/15  0110 History  By signing my name below, I, Arianna Nassar, attest that this documentation has been prepared under the direction and in the presence of Jola Schmidt, MD. Electronically Signed: Julien Nordmann, ED Scribe. 03/12/2015. 3:52 AM.    Chief Complaint  Patient presents with  . Chest Pain     The history is provided by the patient. No language interpreter was used.   HPI Comments: Audrey Rodriguez is a 44 y.o. female who has a hx of GERD and hypercholesteremia presents to the Emergency Department complaining of acute onset, intermittent, gradual improving chest pain onset this evening. She states the pain radiated to her neck. She has a maternal family history of MI below the age of 41. Pt says she took one aspirin to alleviate the symptoms and pain with no relief. Pt denies shortness of breath, nausea, vomiting, and diaphoresis. Pt is a non-smoker.  Past Medical History  Diagnosis Date  . Heart murmur   . Anemia   . GERD (gastroesophageal reflux disease)     no meds  . Anxiety   . Hypercholesteremia    Past Surgical History  Procedure Laterality Date  . Cesarean section      x 5   Family History  Problem Relation Age of Onset  . Heart disease Mother   . Diabetes Father   . Kidney disease Father    Social History  Substance Use Topics  . Smoking status: Former Smoker    Quit date: 12/28/1997  . Smokeless tobacco: None  . Alcohol Use: No   OB History    Gravida Para Term Preterm AB TAB SAB Ectopic Multiple Living   5 5        5      Review of Systems  A complete 10 system review of systems was obtained and all systems are negative except as noted in the HPI and PMH.   Allergies  Review of patient's allergies indicates no known allergies.  Home Medications   Prior to Admission medications   Medication Sig Start Date End Date Taking? Authorizing Provider  cyclobenzaprine (FLEXERIL) 10 MG tablet Take 1 tablet (10 mg  total) by mouth 2 (two) times daily as needed for muscle spasms. 10/16/14   Gareth Morgan, MD  ergocalciferol (VITAMIN D2) 50000 UNITS capsule Take 50,000 Units by mouth once a week. Tuesdays.    Historical Provider, MD  ferrous sulfate 325 (65 FE) MG tablet Take 325 mg by mouth daily as needed (for iron).     Historical Provider, MD  ibuprofen (ADVIL,MOTRIN) 800 MG tablet Take 1 tablet (800 mg total) by mouth 3 (three) times daily. 10/16/14   Gareth Morgan, MD  meclizine (ANTIVERT) 25 MG tablet Take 1/2-1 tablet every 6-8 hours as needed for dizziness 02/21/15   Janne Napoleon, NP  meloxicam (MOBIC) 7.5 MG tablet Take 1 tablet (7.5 mg total) by mouth daily. 12/21/14   Trula Slade, DPM  naproxen (NAPROSYN) 500 MG tablet Take 1 tablet (500 mg total) by mouth 2 (two) times daily. 07/20/14   Margarita Mail, PA-C  simvastatin (ZOCOR) 20 MG tablet Take 20 mg by mouth daily.    Historical Provider, MD   Triage vitals: BP 117/79 mmHg  Pulse 72  Temp(Src) 98.5 F (36.9 C) (Oral)  Resp 15  SpO2 100%  LMP 02/20/2015 Physical Exam  Constitutional: She is oriented to person, place, and time. She appears well-developed and well-nourished. No distress.  HENT:  Head: Normocephalic and atraumatic.  Eyes: EOM are normal.  Neck: Normal range of motion.  Cardiovascular: Normal rate, regular rhythm and normal heart sounds.   Pulmonary/Chest: Effort normal and breath sounds normal.  Abdominal: Soft. She exhibits no distension. There is no tenderness.  Musculoskeletal: Normal range of motion.  Neurological: She is alert and oriented to person, place, and time.  Skin: Skin is warm and dry.  Psychiatric: She has a normal mood and affect. Judgment normal.  Nursing note and vitals reviewed.   ED Course  Procedures  DIAGNOSTIC STUDIES: Oxygen Saturation is 100% on RA, normal by my interpretation.  COORDINATION OF CARE:  3:51 AM Discussed treatment plan with pt at bedside and pt agreed to plan.  Labs  Review Labs Reviewed  BASIC METABOLIC PANEL - Abnormal; Notable for the following:    Glucose, Bld 131 (*)    All other components within normal limits  CBC - Abnormal; Notable for the following:    Hemoglobin 11.5 (*)    HCT 35.8 (*)    All other components within normal limits  I-STAT TROPOININ, ED    Imaging Review Dg Chest 2 View  03/12/2015  CLINICAL DATA:  44 year old female with left chest pain EXAM: CHEST  2 VIEW COMPARISON:  Chest radiograph dated 10/02/2013 FINDINGS: The heart size and mediastinal contours are within normal limits. Both lungs are clear. The visualized skeletal structures are unremarkable. IMPRESSION: No active cardiopulmonary disease. Electronically Signed   By: Anner Crete M.D.   On: 03/12/2015 01:57   I have personally reviewed and evaluated these images and lab results as part of my medical decision-making.   EKG Interpretation   Date/Time:  Tuesday March 12 2015 01:12:03 EST Ventricular Rate:  94 PR Interval:  150 QRS Duration: 82 QT Interval:  348 QTC Calculation: 435 R Axis:   15 Text Interpretation:  Normal sinus rhythm Normal ECG No significant change  was found Confirmed by Khamryn Calderone  MD, Sky Borboa ([id number]) on 03/12/2015 3:52:02 AM      MDM   Final diagnoses:  Chest pain, unspecified chest pain type  Gastroesophageal reflux disease without esophagitis   5:34 AM Patient feels much better after GI cocktail.  This is likely gastroesophageal reflux disease.  Doubt PE.  Doubt ACS.  EKG without ischemic changes.  Troponin is negative.  Discharge home in good condition.  Primary care follow-up.  Instructions to take Prilosec daily.  I personally performed the services described in this documentation, which was scribed in my presence. The recorded information has been reviewed and is accurate.      Jola Schmidt, MD 03/12/15 [phone]

## 2015-03-12 NOTE — ED Notes (Signed)
Pt. reports left chest pain onset this evening , denies SOB , nausea or diaphoresis .

## 2015-03-30 ENCOUNTER — Emergency Department (INDEPENDENT_AMBULATORY_CARE_PROVIDER_SITE_OTHER)
Admission: EM | Admit: 2015-03-30 | Discharge: 2015-03-30 | Disposition: A | Discharge status: Home / Self Care | Payer: Medicaid Other | Source: Home / Self Care

## 2015-03-30 ENCOUNTER — Encounter (HOSPITAL_COMMUNITY): Payer: Self-pay | Admitting: Emergency Medicine

## 2015-03-30 DIAGNOSIS — S46812A Strain of other muscles, fascia and tendons at shoulder and upper arm level, left arm, initial encounter: Secondary | ICD-10-CM

## 2015-03-30 MED ORDER — CYCLOBENZAPRINE HCL 10 MG PO TABS: 10.0000 mg | ORAL_TABLET | Freq: Two times a day (BID) | ORAL | Status: DC | PRN

## 2015-03-30 NOTE — Discharge Instructions (Signed)

## 2015-03-30 NOTE — ED Notes (Signed)
C/o neck pain onset 3 days... Pain radiates down the back  Reports she has been doing heavy lifting at home... Pain increases w/activity Denies inj/trauma... Steady gait A&O x4... No acute distress.

## 2015-03-30 NOTE — ED Notes (Signed)
Pt d/c by [facility], pa

## 2015-05-03 ENCOUNTER — Encounter (HOSPITAL_COMMUNITY): Payer: Self-pay | Admitting: Vascular Surgery

## 2015-05-03 ENCOUNTER — Emergency Department (HOSPITAL_COMMUNITY)
Admission: EM | Admit: 2015-05-03 | Discharge: 2015-05-03 | Disposition: A | Discharge status: Home / Self Care | Payer: Medicaid Other | Attending: Emergency Medicine | Admitting: Emergency Medicine

## 2015-05-03 DIAGNOSIS — H6591 Unspecified nonsuppurative otitis media, right ear: Secondary | ICD-10-CM | POA: Diagnosis not present

## 2015-05-03 DIAGNOSIS — R011 Cardiac murmur, unspecified: Secondary | ICD-10-CM | POA: Insufficient documentation

## 2015-05-03 DIAGNOSIS — H9201 Otalgia, right ear: Secondary | ICD-10-CM | POA: Diagnosis present

## 2015-05-03 DIAGNOSIS — K219 Gastro-esophageal reflux disease without esophagitis: Secondary | ICD-10-CM | POA: Insufficient documentation

## 2015-05-03 DIAGNOSIS — R51 Headache: Secondary | ICD-10-CM | POA: Insufficient documentation

## 2015-05-03 DIAGNOSIS — R42 Dizziness and giddiness: Secondary | ICD-10-CM | POA: Diagnosis not present

## 2015-05-03 DIAGNOSIS — Z87891 Personal history of nicotine dependence: Secondary | ICD-10-CM | POA: Diagnosis not present

## 2015-05-03 DIAGNOSIS — H6691 Otitis media, unspecified, right ear: Secondary | ICD-10-CM

## 2015-05-03 DIAGNOSIS — Z79899 Other long term (current) drug therapy: Secondary | ICD-10-CM | POA: Insufficient documentation

## 2015-05-03 DIAGNOSIS — Z862 Personal history of diseases of the blood and blood-forming organs and certain disorders involving the immune mechanism: Secondary | ICD-10-CM | POA: Diagnosis not present

## 2015-05-03 DIAGNOSIS — Z8659 Personal history of other mental and behavioral disorders: Secondary | ICD-10-CM | POA: Insufficient documentation

## 2015-05-03 DIAGNOSIS — Z792 Long term (current) use of antibiotics: Secondary | ICD-10-CM | POA: Diagnosis not present

## 2015-05-03 DIAGNOSIS — E7800 Pure hypercholesterolemia, unspecified: Secondary | ICD-10-CM | POA: Diagnosis not present

## 2015-05-03 DIAGNOSIS — R519 Headache, unspecified: Secondary | ICD-10-CM

## 2015-05-03 MED ORDER — FLUTICASONE PROPIONATE 50 MCG/ACT NA SUSP: NASAL | Status: DC

## 2015-05-03 MED ORDER — AMOXICILLIN 500 MG PO CAPS: 500.0000 mg | ORAL_CAPSULE | Freq: Three times a day (TID) | ORAL | Status: DC

## 2015-05-03 MED ORDER — MECLIZINE HCL 25 MG PO TABS: 25.0000 mg | ORAL_TABLET | Freq: Three times a day (TID) | ORAL | Status: DC | PRN

## 2015-05-03 NOTE — Discharge Instructions (Signed)
Benign Positional Vertigo °Vertigo is the feeling that you or your surroundings are moving when they are not. Benign positional vertigo is the most common form of vertigo. The cause of this condition is not serious (is benign). This condition is triggered by certain movements and positions (is positional). This condition can be dangerous if it occurs while you are doing something that could endanger you or others, such as driving.  °CAUSES °In many cases, the cause of this condition is not known. It may be caused by a disturbance in an area of the inner ear that helps your brain to sense movement and balance. This disturbance can be caused by a viral infection (labyrinthitis), head injury, or repetitive motion. °RISK FACTORS °This condition is more likely to develop in: °· Women. °· People who are 50 years of age or older. °SYMPTOMS °Symptoms of this condition usually happen when you move your head or your eyes in different directions. Symptoms may start suddenly, and they usually last for less than a minute. Symptoms may include: °· Loss of balance and falling. °· Feeling like you are spinning or moving. °· Feeling like your surroundings are spinning or moving. °· Nausea and vomiting. °· Blurred vision. °· Dizziness. °· Involuntary eye movement (nystagmus). °Symptoms can be mild and cause only slight annoyance, or they can be severe and interfere with daily life. Episodes of benign positional vertigo may return (recur) over time, and they may be triggered by certain movements. Symptoms may improve over time. °DIAGNOSIS °This condition is usually diagnosed by medical history and a physical exam of the head, neck, and ears. You may be referred to a health care provider who specializes in ear, nose, and throat (ENT) problems (otolaryngologist) or a provider who specializes in disorders of the nervous system (neurologist). You may have additional testing, including: °· MRI. °· A CT scan. °· Eye movement tests. Your  health care provider may ask you to change positions quickly while he or she watches you for symptoms of benign positional vertigo, such as nystagmus. Eye movement may be tested with an electronystagmogram (ENG), caloric stimulation, the Dix-Hallpike test, or the roll test. °· An electroencephalogram (EEG). This records electrical activity in your brain. °· Hearing tests. °TREATMENT °Usually, your health care provider will treat this by moving your head in specific positions to adjust your inner ear back to normal. Surgery may be needed in severe cases, but this is rare. In some cases, benign positional vertigo may resolve on its own in 2-4 weeks. °HOME CARE INSTRUCTIONS °Safety °· Move slowly. Avoid sudden body or head movements. °· Avoid driving. °· Avoid operating heavy machinery. °· Avoid doing any tasks that would be dangerous to you or others if a vertigo episode would occur. °· If you have trouble walking or keeping your balance, try using a cane for stability. If you feel dizzy or unstable, sit down right away. °· Return to your normal activities as told by your health care provider. Ask your health care provider what activities are safe for you. °General Instructions °· Take over-the-counter and prescription medicines only as told by your health care provider. °· Avoid certain positions or movements as told by your health care provider. °· Drink enough fluid to keep your urine clear or pale yellow. °· Keep all follow-up visits as told by your health care provider. This is important. °SEEK MEDICAL CARE IF: °· You have a fever. °· Your condition gets worse or you develop new symptoms. °· Your family or friends   notice any behavioral changes.  Your nausea or vomiting gets worse.  You have numbness or a "pins and needles" sensation. SEEK IMMEDIATE MEDICAL CARE IF:  You have difficulty speaking or moving.  You are always dizzy.  You faint.  You develop severe headaches.  You have weakness in your  legs or arms.  You have changes in your hearing or vision.  You develop a stiff neck.  You develop sensitivity to light.   This information is not intended to replace advice given to you by your health care provider. Make sure you discuss any questions you have with your health care provider.   Document Released: 12/29/2005 Document Revised: 12/12/2014 Document Reviewed: 07/16/2014 Elsevier Interactive Patient Education 2016 [employer].  Otitis Media, Adult Otitis media is redness, soreness, and puffiness (swelling) in the space just behind your eardrum (middle ear). It may be caused by allergies or infection. It often happens along with a cold. HOME CARE  Take your medicine as told. Finish it even if you start to feel better.  Only take over-the-counter or prescription medicines for pain, discomfort, or fever as told by your doctor.  Follow up with your doctor as told. GET HELP IF:  You have otitis media only in one ear, or bleeding from your nose, or both.  You notice a lump on your neck.  You are not getting better in 3-5 days.  You feel worse instead of better. GET HELP RIGHT AWAY IF:   You have pain that is not helped with medicine.  You have puffiness, redness, or pain around your ear.  You get a stiff neck.  You cannot move part of your face (paralysis).  You notice that the bone behind your ear hurts when you touch it. MAKE SURE YOU:   Understand these instructions.  Will watch your condition.  Will get help right away if you are not doing well or get worse.   This information is not intended to replace advice given to you by your health care provider. Make sure you discuss any questions you have with your health care provider.   Document Released: 09/09/2007 Document Revised: 04/13/2014 Document Reviewed: 10/18/2012 Elsevier Interactive Patient Education [employer].

## 2015-05-03 NOTE — ED Provider Notes (Signed)
CSN: [id number]     Arrival date & time 05/03/15  0935 History   First MD Initiated Contact with Patient 05/03/15 0940     Chief Complaint  Patient presents with  . Headache  . Otalgia      HPI  Patient presents evaluation of ear pain headache and dizziness. She states that she's felt like she's had "a cold" for 2 or 3 days sinus congestion nasal congestion now runny nose. Pain right ear. For the last 2 days she states when she gets up or changes positions or stands she feels dizziness like things are spinning around. This is been subtle and intermittent. Diarrhea 2 days ago but none since no nausea or vomiting.  Sudden onset of headache or thunderclap-type event. No neck pain or stiffness. No vision changes.  Past Medical History  Diagnosis Date  . Heart murmur   . Anemia   . GERD (gastroesophageal reflux disease)     no meds  . Anxiety   . Hypercholesteremia    Past Surgical History  Procedure Laterality Date  . Cesarean section      x 5   Family History  Problem Relation Age of Onset  . Heart disease Mother   . Diabetes Father   . Kidney disease Father    Social History  Substance Use Topics  . Smoking status: Former Smoker    Quit date: 12/28/1997  . Smokeless tobacco: None  . Alcohol Use: No   OB History    Gravida Para Term Preterm AB TAB SAB Ectopic Multiple Living   5 5        5      Review of Systems  Constitutional: Negative for fever, chills, diaphoresis, appetite change and fatigue.  HENT: Positive for congestion, ear pain and sinus pressure. Negative for mouth sores, sore throat and trouble swallowing.   Eyes: Negative for visual disturbance.  Respiratory: Negative for cough, chest tightness, shortness of breath and wheezing.   Cardiovascular: Negative for chest pain.  Gastrointestinal: Negative for nausea, vomiting, abdominal pain, diarrhea and abdominal distention.  Endocrine: Negative for polydipsia, polyphagia and polyuria.  Genitourinary:  Negative for dysuria, frequency and hematuria.  Musculoskeletal: Negative for gait problem.  Skin: Negative for color change, pallor and rash.  Neurological: Positive for dizziness and headaches. Negative for syncope and light-headedness.  Hematological: Does not bruise/bleed easily.  Psychiatric/Behavioral: Negative for behavioral problems and confusion.      Allergies  Review of patient's allergies indicates no known allergies.  Home Medications   Prior to Admission medications   Medication Sig Start Date End Date Taking? Authorizing Provider  amoxicillin (AMOXIL) 500 MG capsule Take 1 capsule (500 mg total) by mouth 3 (three) times daily. 05/03/15   Tanna Furry, MD  ciprofloxacin (CIPRO) 250 MG tablet Take 250 mg by mouth 2 (two) times daily. 03/04/15   Historical Provider, MD  cyclobenzaprine (FLEXERIL) 10 MG tablet Take 1 tablet (10 mg total) by mouth 2 (two) times daily as needed for muscle spasms. 03/30/15   Konrad Felix, PA  fluticasone Asencion Islam) 50 MCG/ACT nasal spray 1 spray each nares twice a day 05/03/15   Tanna Furry, MD  meclizine (ANTIVERT) 25 MG tablet Take 1 tablet (25 mg total) by mouth 3 (three) times daily as needed. 05/03/15   Tanna Furry, MD  omeprazole (PRILOSEC) 20 MG capsule Take 1 capsule (20 mg total) by mouth daily. 03/12/15   Jola Schmidt, MD  simvastatin (ZOCOR) 20 MG tablet Take 20 mg by  mouth daily.    Historical Provider, MD   BP 122/77 mmHg  Pulse 89  Temp(Src) 98.4 F (36.9 C) (Oral)  Resp 16  SpO2 99% Physical Exam  Constitutional: She is oriented to person, place, and time. She appears well-developed and well-nourished. No distress.  HENT:  Head: Normocephalic.  Right Ear: Ear canal normal. A middle ear effusion is present.  Left Ear: Tympanic membrane normal.  Eyes: Conjunctivae are normal. Pupils are equal, round, and reactive to light. No scleral icterus.  No nystagmus with lateral to lateral gaze.  Neck: Normal range of motion. Neck supple.  No thyromegaly present.  Cardiovascular: Normal rate and regular rhythm.  Exam reveals no gallop and no friction rub.   No murmur heard. Pulmonary/Chest: Effort normal and breath sounds normal. No respiratory distress. She has no wheezes. She has no rales.  Abdominal: Soft. Bowel sounds are normal. She exhibits no distension. There is no tenderness. There is no rebound.  Musculoskeletal: Normal range of motion.  Neurological: She is alert and oriented to person, place, and time.  Skin: Skin is warm and dry. No rash noted.  Psychiatric: She has a normal mood and affect. Her behavior is normal.    ED Course  Procedures (including critical care time) Labs Review Labs Reviewed - No data to display  Imaging Review No results found. I have personally reviewed and evaluated these images and lab results as part of my medical decision-making.   EKG Interpretation None      MDM   Final diagnoses:  Otitis, right  Vertigo  Nonintractable headache, unspecified chronicity pattern, unspecified headache type    Subtle otitis but patient reports vertigo. We'll treat with Flonase, over-the-counter Sudafed, meclizine, amoxicillin. Primary care follow-up. Driving. And vertigo precautions discussed.    Tanna Furry, MD 05/03/15 1014

## 2015-05-03 NOTE — ED Notes (Signed)
Pt reports to the ED for eval of sharp head pain, right eye, and right ear pain that began last night Pt denies any vision changes or ear drainage. Pt reports chills but unsure of fever. Denies any photophobia or HA that is worse with sound. She denies any N/V. Had diarrhea on Wednesday but has not had any since. Pt denies any hx of migraines. Pt A&Ox4, resp e/u, and skin warm and dry.

## 2015-05-05 NOTE — ED Provider Notes (Signed)
CSN: [id number]     Arrival date & time 03/30/15  1132 History   None    Chief Complaint  Patient presents with  . Neck Pain   (Consider location/radiation/quality/duration/timing/severity/associated sxs/prior Treatment) HPI History obtained from patient:   LOCATION: left neck and upper bac SEVERITY:6 DURATION:3 days CONTEXT:heavy lifting QUALITY:aches MODIFYING FACTORS:cold packs tylenol, icy hot ASSOCIATED SYMPTOMS: pain radiating into shoulder and back TIMING:cconstant OCCUPATION:   Past Medical History  Diagnosis Date  . Heart murmur   . Anemia   . GERD (gastroesophageal reflux disease)     no meds  . Anxiety   . Hypercholesteremia    Past Surgical History  Procedure Laterality Date  . Cesarean section      x 5   Family History  Problem Relation Age of Onset  . Heart disease Mother   . Diabetes Father   . Kidney disease Father    Social History  Substance Use Topics  . Smoking status: Former Smoker    Quit date: 12/28/1997  . Smokeless tobacco: None  . Alcohol Use: No   OB History    Gravida Para Term Preterm AB TAB SAB Ectopic Multiple Living   5 5        5      Review of Systems ROS +'ve left neck/upper back pain for 3 days  Denies: HEADACHE, NAUSEA, ABDOMINAL PAIN, CHEST PAIN, CONGESTION, DYSURIA, SHORTNESS OF BREATH  Allergies  Review of patient's allergies indicates no known allergies.  Home Medications   Prior to Admission medications   Medication Sig Start Date End Date Taking? Authorizing Provider  amoxicillin (AMOXIL) 500 MG capsule Take 1 capsule (500 mg total) by mouth 3 (three) times daily. 05/03/15   Tanna Furry, MD  ciprofloxacin (CIPRO) 250 MG tablet Take 250 mg by mouth 2 (two) times daily. 03/04/15   Historical Provider, MD  cyclobenzaprine (FLEXERIL) 10 MG tablet Take 1 tablet (10 mg total) by mouth 2 (two) times daily as needed for muscle spasms. 03/30/15   Konrad Felix, PA  fluticasone Asencion Islam) 50 MCG/ACT nasal spray 1  spray each nares twice a day 05/03/15   Tanna Furry, MD  meclizine (ANTIVERT) 25 MG tablet Take 1 tablet (25 mg total) by mouth 3 (three) times daily as needed. 05/03/15   Tanna Furry, MD  omeprazole (PRILOSEC) 20 MG capsule Take 1 capsule (20 mg total) by mouth daily. 03/12/15   Jola Schmidt, MD  simvastatin (ZOCOR) 20 MG tablet Take 20 mg by mouth daily.    Historical Provider, MD   Meds Ordered and Administered this Visit  Medications - No data to display  BP 132/92 mmHg  Pulse 79  Temp(Src) 98.3 F (36.8 C) (Oral)  Resp 16  SpO2 99%  LMP 03/22/2015 No data found.   Physical Exam  Constitutional: She appears well-developed and well-nourished.  HENT:  Head: Normocephalic and atraumatic.  Neck: Muscular tenderness present. No spinous process tenderness present. No rigidity. Decreased range of motion present.    Pulmonary/Chest: Effort normal.  Musculoskeletal: She exhibits tenderness.  Lymphadenopathy:    She has no cervical adenopathy.    ED Course  Procedures (including critical care time)  Labs Review Labs Reviewed - No data to display  Imaging Review No results found.   Visual Acuity Review  Right Eye Distance:   Left Eye Distance:   Bilateral Distance:    Right Eye Near:   Left Eye Near:    Bilateral Near:  MDM   1. Trapezius strain, left, initial encounter    Patient is advised to continue home symptomatic treatment. Prescription for flexeril  sent pharmacy patient has indicated. Patient is advised that if there are new or worsening symptoms or attend the emergency department, or contact primary care provider. Instructions of care provided discharged home in stable condition.  THIS NOTE WAS GENERATED USING A VOICE RECOGNITION SOFTWARE PROGRAM. ALL REASONABLE EFFORTS  WERE MADE TO PROOFREAD THIS DOCUMENT FOR ACCURACY.     Konrad Felix, PA 05/05/15 1000

## 2015-12-30 ENCOUNTER — Other Ambulatory Visit: Payer: Self-pay | Admitting: Internal Medicine

## 2015-12-30 DIAGNOSIS — E2839 Other primary ovarian failure: Secondary | ICD-10-CM

## 2016-01-06 ENCOUNTER — Other Ambulatory Visit: Payer: Self-pay | Admitting: Internal Medicine

## 2016-01-06 DIAGNOSIS — Z1231 Encounter for screening mammogram for malignant neoplasm of breast: Secondary | ICD-10-CM

## 2016-01-08 ENCOUNTER — Inpatient Hospital Stay: Admission: RE | Admit: 2016-01-08 | Payer: Medicaid Other | Source: Ambulatory Visit

## 2016-01-20 ENCOUNTER — Other Ambulatory Visit: Payer: Medicaid Other

## 2016-03-10 ENCOUNTER — Ambulatory Visit: Payer: Medicaid Other

## 2016-03-19 ENCOUNTER — Other Ambulatory Visit (HOSPITAL_COMMUNITY)
Admission: RE | Admit: 2016-03-19 | Discharge: 2016-03-19 | Disposition: A | Discharge status: Home / Self Care | Payer: Medicaid Other | Source: Ambulatory Visit | Attending: Certified Nurse Midwife | Admitting: Certified Nurse Midwife

## 2016-03-19 ENCOUNTER — Encounter: Payer: Self-pay | Admitting: Certified Nurse Midwife

## 2016-03-19 ENCOUNTER — Ambulatory Visit (INDEPENDENT_AMBULATORY_CARE_PROVIDER_SITE_OTHER): Payer: Medicaid Other | Admitting: Certified Nurse Midwife

## 2016-03-19 VITALS — BP 128/82 | HR 80 | Ht 61.0 in | Wt 171.0 lb

## 2016-03-19 DIAGNOSIS — Z01419 Encounter for gynecological examination (general) (routine) without abnormal findings: Secondary | ICD-10-CM

## 2016-03-19 DIAGNOSIS — Z0000 Encounter for general adult medical examination without abnormal findings: Secondary | ICD-10-CM | POA: Diagnosis not present

## 2016-03-19 DIAGNOSIS — Z1151 Encounter for screening for human papillomavirus (HPV): Secondary | ICD-10-CM | POA: Diagnosis not present

## 2016-03-19 DIAGNOSIS — Z113 Encounter for screening for infections with a predominantly sexual mode of transmission: Secondary | ICD-10-CM

## 2016-03-19 NOTE — Progress Notes (Signed)
Subjective:        Audrey Rodriguez is a 45 y.o. female here for a routine exam.  Current complaints: none.  Hx of fibroids. Married desires full STD screening exam.  Reports constipation.    Personal health questionnaire:  Is patient Ashkenazi Jewish, have a family history of breast and/or ovarian cancer: no Is there a family history of uterine cancer diagnosed at age < 69, gastrointestinal cancer, urinary tract cancer, family member who is a Field seismologist syndrome-associated carrier: no Is the patient overweight and hypertensive, family history of diabetes, personal history of gestational diabetes, preeclampsia or PCOS: yes Is patient over 10, have PCOS,  family history of premature CHD under age 9, diabetes, smoke, have hypertension or peripheral artery disease:  yes At any time, has a partner hit, kicked or otherwise hurt or frightened you?: no Over the past 2 weeks, have you felt down, depressed or hopeless?: no Over the past 2 weeks, have you felt little interest or pleasure in doing things?:no   Gynecologic History Patient's last menstrual period was 02/27/2016. Contraception: tubal ligation Last Pap: >1 year ago. Results were: normal according to the patient, in Epic 02/27/12:normal Last mammogram: 03/2015. Results were: normal, hx of benign cysts right side.   Obstetric History OB History  Gravida Para Term Preterm AB Living  5 5       5   SAB TAB Ectopic Multiple Live Births               # Outcome Date GA Lbr Len/2nd Weight Sex Delivery Anes PTL Lv  5 Para      CS-Unspec     4 Para      CS-Unspec     3 Para      CS-Unspec     2 Para      CS-Unspec     1 Para      CS-Unspec         Past Medical History:  Diagnosis Date  . Anemia   . Anxiety   . GERD (gastroesophageal reflux disease)    no meds  . Heart murmur   . Hypercholesteremia     Past Surgical History:  Procedure Laterality Date  . CESAREAN SECTION     x 5     Current Outpatient Prescriptions:  .   fluticasone (FLONASE) 50 MCG/ACT nasal spray, 1 spray each nares twice a day, Disp: 10 g, Rfl: 1 .  simvastatin (ZOCOR) 20 MG tablet, Take 20 mg by mouth daily., Disp: , Rfl:  .  meclizine (ANTIVERT) 25 MG tablet, Take 1 tablet (25 mg total) by mouth 3 (three) times daily as needed. (Patient not taking: Reported on 03/19/2016), Disp: 20 tablet, Rfl: 0 .  omeprazole (PRILOSEC) 20 MG capsule, Take 1 capsule (20 mg total) by mouth daily. (Patient not taking: Reported on 03/19/2016), Disp: 30 capsule, Rfl: 0 No Known Allergies  Social History  Substance Use Topics  . Smoking status: Former Smoker    Quit date: 12/28/1997  . Smokeless tobacco: Never Used  . Alcohol use No    Family History  Problem Relation Age of Onset  . Heart disease Mother   . Diabetes Father   . Kidney disease Father       Review of Systems  Constitutional: negative for fatigue and weight loss Respiratory: negative for cough and wheezing Cardiovascular: negative for chest pain, fatigue and palpitations Gastrointestinal: negative for abdominal pain and change in bowel habits Musculoskeletal:negative for myalgias Neurological: negative for  gait problems and tremors Behavioral/Psych: negative for abusive relationship, depression Endocrine: negative for temperature intolerance    Genitourinary:negative for abnormal menstrual periods, genital lesions, hot flashes, sexual problems and vaginal discharge Integument/breast: negative for breast lump, breast tenderness, nipple discharge and skin lesion(s)    Objective:       BP 128/82   Pulse 80   Ht 5\' 1"  (1.549 m)   Wt 171 lb (77.6 kg)   LMP 02/27/2016   BMI 32.31 kg/m  General:   alert  Skin:   no rash or abnormalities  Lungs:   clear to auscultation bilaterally  Heart:   regular rate and rhythm, S1, S2 normal, no murmur, click, rub or gallop  Breasts:   normal without suspicious masses, skin or nipple changes or axillary nodes  Abdomen:  normal findings: no  organomegaly, soft, non-tender and no hernia Healed with scarring incision from multiple c-sections noted.   Pelvis:  External genitalia: normal general appearance Urinary system: urethral meatus normal and bladder without fullness, nontender Vaginal: normal without tenderness, induration or masses Cervix: normal appearance Adnexa: normal bimanual exam Uterus: anteverted and non-tender, slightly enlarged in size   Lab Review Urine pregnancy test Labs reviewed yes Radiologic studies reviewed yes  50% of 30 min visit spent on counseling and coordination of care.    Assessment:    Healthy female exam.   STD screening exam  H/O 5 vertical C-sections  H/O uterine fibroids  Constipation  Plan:    Education reviewed: calcium supplements, depression evaluation, low fat, low cholesterol diet, safe sex/STD prevention, self breast exams, skin cancer screening and weight bearing exercise. Contraception: tubal ligation. Mammogram ordered. Follow up in: 1 year.   No orders of the defined types were placed in this encounter.  Orders Placed This Encounter  Procedures  . MM DIGITAL SCREENING BILATERAL    Standing Status:   Future    Standing Expiration Date:   05/20/2017    Order Specific Question:   Reason for Exam (SYMPTOM  OR DIAGNOSIS REQUIRED)    Answer:   annual exam    Order Specific Question:   Is the patient pregnant?    Answer:   No    Order Specific Question:   Preferred imaging location?    Answer:   [facility]  . RPR  . Hepatitis C antibody  . Hepatitis B surface antigen  . HIV antibody   Need to obtain previous records Possible management options include: urogynecology consult.   Follow up as needed.

## 2016-03-20 LAB — HIV ANTIBODY (ROUTINE TESTING W REFLEX): HIV Screen 4th Generation wRfx: NONREACTIVE

## 2016-03-20 LAB — RPR: RPR Ser Ql: NONREACTIVE

## 2016-03-20 LAB — HEPATITIS B SURFACE ANTIGEN: Hepatitis B Surface Ag: NEGATIVE

## 2016-03-20 LAB — HEPATITIS C ANTIBODY: Hep C Virus Ab: 0.5 s/co ratio (ref 0.0–0.9)

## 2016-03-24 LAB — CYTOLOGY - PAP
DIAGNOSIS: NEGATIVE
HPV (WINDOPATH): NOT DETECTED

## 2016-03-25 LAB — NUSWAB VG+, CANDIDA 6SP
CANDIDA ALBICANS, NAA: NEGATIVE
CANDIDA LUSITANIAE, NAA: NEGATIVE
CANDIDA PARAPSILOSIS, NAA: NEGATIVE
CANDIDA TROPICALIS, NAA: NEGATIVE
CHLAMYDIA TRACHOMATIS, NAA: NEGATIVE
Candida glabrata, NAA: NEGATIVE
Candida krusei, NAA: NEGATIVE
Neisseria gonorrhoeae, NAA: NEGATIVE
TRICH VAG BY NAA: NEGATIVE

## 2016-04-14 ENCOUNTER — Ambulatory Visit: Payer: Medicaid Other

## 2016-05-14 ENCOUNTER — Ambulatory Visit
Admission: RE | Admit: 2016-05-14 | Discharge: 2016-05-14 | Disposition: A | Discharge status: Home / Self Care | Payer: Medicaid Other | Source: Ambulatory Visit | Attending: Internal Medicine | Admitting: Internal Medicine

## 2016-05-14 ENCOUNTER — Ambulatory Visit: Payer: Medicaid Other

## 2016-05-14 DIAGNOSIS — Z1231 Encounter for screening mammogram for malignant neoplasm of breast: Secondary | ICD-10-CM

## 2016-05-14 DIAGNOSIS — E2839 Other primary ovarian failure: Secondary | ICD-10-CM

## 2016-05-29 ENCOUNTER — Encounter: Payer: Self-pay | Admitting: *Deleted

## 2016-08-19 ENCOUNTER — Emergency Department (HOSPITAL_COMMUNITY)
Admission: EM | Admit: 2016-08-19 | Discharge: 2016-08-19 | Discharge status: Against Medical Advice | Payer: Medicaid Other | Attending: Emergency Medicine | Admitting: Emergency Medicine

## 2016-08-19 ENCOUNTER — Encounter (HOSPITAL_COMMUNITY): Payer: Self-pay | Admitting: Emergency Medicine

## 2016-08-19 DIAGNOSIS — K921 Melena: Secondary | ICD-10-CM | POA: Diagnosis not present

## 2016-08-19 DIAGNOSIS — Z5321 Procedure and treatment not carried out due to patient leaving prior to being seen by health care provider: Secondary | ICD-10-CM | POA: Insufficient documentation

## 2016-08-19 DIAGNOSIS — Z87891 Personal history of nicotine dependence: Secondary | ICD-10-CM | POA: Diagnosis not present

## 2016-08-19 DIAGNOSIS — Z79899 Other long term (current) drug therapy: Secondary | ICD-10-CM | POA: Diagnosis not present

## 2016-08-19 DIAGNOSIS — R531 Weakness: Secondary | ICD-10-CM | POA: Diagnosis present

## 2016-08-19 DIAGNOSIS — R42 Dizziness and giddiness: Secondary | ICD-10-CM | POA: Diagnosis not present

## 2016-08-19 DIAGNOSIS — R5383 Other fatigue: Secondary | ICD-10-CM | POA: Diagnosis not present

## 2016-08-19 LAB — CBC WITH DIFFERENTIAL/PLATELET
BASOS ABS: 0 10*3/uL (ref 0.0–0.1)
BASOS PCT: 0 %
EOS ABS: 0.1 10*3/uL (ref 0.0–0.7)
EOS PCT: 1 %
HCT: 36.5 % (ref 36.0–46.0)
Hemoglobin: 11.8 g/dL — ABNORMAL LOW (ref 12.0–15.0)
Lymphocytes Relative: 28 %
Lymphs Abs: 2.2 10*3/uL (ref 0.7–4.0)
MCH: 27 pg (ref 26.0–34.0)
MCHC: 32.3 g/dL (ref 30.0–36.0)
MCV: 83.5 fL (ref 78.0–100.0)
MONO ABS: 0.4 10*3/uL (ref 0.1–1.0)
Monocytes Relative: 5 %
Neutro Abs: 5.3 10*3/uL (ref 1.7–7.7)
Neutrophils Relative %: 66 %
Platelets: 346 10*3/uL (ref 150–400)
RBC: 4.37 MIL/uL (ref 3.87–5.11)
RDW: 13 % (ref 11.5–15.5)
WBC: 8.1 10*3/uL (ref 4.0–10.5)

## 2016-08-19 LAB — COMPREHENSIVE METABOLIC PANEL
ALBUMIN: 4.4 g/dL (ref 3.5–5.0)
ALT: 21 U/L (ref 14–54)
AST: 22 U/L (ref 15–41)
Alkaline Phosphatase: 71 U/L (ref 38–126)
Anion gap: 8 (ref 5–15)
BUN: 10 mg/dL (ref 6–20)
CO2: 22 mmol/L (ref 22–32)
Calcium: 9.3 mg/dL (ref 8.9–10.3)
Chloride: 107 mmol/L (ref 101–111)
Creatinine, Ser: 0.78 mg/dL (ref 0.44–1.00)
GFR calc Af Amer: >60 mL/min (ref >60)
GLUCOSE: 92 mg/dL (ref 65–99)
POTASSIUM: 3.5 mmol/L (ref 3.5–5.1)
SODIUM: 137 mmol/L (ref 135–145)
Total Bilirubin: 0.4 mg/dL (ref 0.3–1.2)
Total Protein: 7.2 g/dL (ref 6.5–8.1)

## 2016-08-19 LAB — URINALYSIS, ROUTINE W REFLEX MICROSCOPIC
Bilirubin Urine: NEGATIVE
Glucose, UA: NEGATIVE mg/dL
HGB URINE DIPSTICK: NEGATIVE
Ketones, ur: NEGATIVE mg/dL
Leukocytes, UA: NEGATIVE
Nitrite: NEGATIVE
Protein, ur: NEGATIVE mg/dL
Specific Gravity, Urine: 1.003 — ABNORMAL LOW (ref 1.005–1.030)
pH: 5 (ref 5.0–8.0)

## 2016-08-19 LAB — I-STAT BETA HCG BLOOD, ED (MC, WL, AP ONLY)

## 2016-08-19 NOTE — ED Notes (Signed)
Pt states she unable to sit and wait any longer

## 2016-08-19 NOTE — ED Triage Notes (Addendum)
Patient reports generalized weakness with fatigue and dizziness onset this afternoon , denies fever or chills . Pt. added 1 episode of bloody stool yesterday.

## 2016-12-15 ENCOUNTER — Emergency Department (HOSPITAL_COMMUNITY): Payer: Medicaid Other

## 2016-12-15 ENCOUNTER — Encounter (HOSPITAL_COMMUNITY): Payer: Self-pay | Admitting: *Deleted

## 2016-12-15 ENCOUNTER — Emergency Department (HOSPITAL_COMMUNITY)
Admission: EM | Admit: 2016-12-15 | Discharge: 2016-12-15 | Discharge status: Against Medical Advice | Payer: Medicaid Other | Attending: Emergency Medicine | Admitting: Emergency Medicine

## 2016-12-15 DIAGNOSIS — Z5321 Procedure and treatment not carried out due to patient leaving prior to being seen by health care provider: Secondary | ICD-10-CM | POA: Diagnosis not present

## 2016-12-15 DIAGNOSIS — R079 Chest pain, unspecified: Secondary | ICD-10-CM | POA: Diagnosis not present

## 2016-12-15 LAB — CBC
HEMATOCRIT: 35.1 % — AB (ref 36.0–46.0)
Hemoglobin: 11.1 g/dL — ABNORMAL LOW (ref 12.0–15.0)
MCH: 26.2 pg (ref 26.0–34.0)
MCHC: 31.6 g/dL (ref 30.0–36.0)
MCV: 83 fL (ref 78.0–100.0)
PLATELETS: 301 10*3/uL (ref 150–400)
RBC: 4.23 MIL/uL (ref 3.87–5.11)
RDW: 13.4 % (ref 11.5–15.5)
WBC: 5.7 10*3/uL (ref 4.0–10.5)

## 2016-12-15 LAB — BASIC METABOLIC PANEL
Anion gap: 7 (ref 5–15)
BUN: 11 mg/dL (ref 6–20)
CHLORIDE: 107 mmol/L (ref 101–111)
CO2: 23 mmol/L (ref 22–32)
CREATININE: 0.74 mg/dL (ref 0.44–1.00)
Calcium: 8.7 mg/dL — ABNORMAL LOW (ref 8.9–10.3)
GFR calc Af Amer: >60 mL/min (ref >60)
GFR calc non Af Amer: >60 mL/min (ref >60)
GLUCOSE: 130 mg/dL — AB (ref 65–99)
POTASSIUM: 4.1 mmol/L (ref 3.5–5.1)
SODIUM: 137 mmol/L (ref 135–145)

## 2016-12-15 LAB — I-STAT TROPONIN, ED: Troponin i, poc: 0 ng/mL (ref 0.00–0.08)

## 2016-12-15 NOTE — ED Notes (Signed)
Pt decided not wait. She left

## 2016-12-15 NOTE — ED Triage Notes (Signed)
Pt c/o L sided chest pain that woke her from sleep this morning. Denies sob; pain is intermittent

## 2017-04-23 ENCOUNTER — Encounter (HOSPITAL_COMMUNITY): Payer: Self-pay | Admitting: Emergency Medicine

## 2017-04-23 ENCOUNTER — Other Ambulatory Visit: Payer: Self-pay

## 2017-04-23 ENCOUNTER — Emergency Department (HOSPITAL_COMMUNITY): Payer: Medicaid Other

## 2017-04-23 ENCOUNTER — Emergency Department (HOSPITAL_COMMUNITY)
Admission: EM | Admit: 2017-04-23 | Discharge: 2017-04-23 | Disposition: A | Discharge status: Home / Self Care | Payer: Medicaid Other | Attending: Emergency Medicine | Admitting: Emergency Medicine

## 2017-04-23 DIAGNOSIS — R079 Chest pain, unspecified: Secondary | ICD-10-CM | POA: Diagnosis present

## 2017-04-23 DIAGNOSIS — R0789 Other chest pain: Secondary | ICD-10-CM | POA: Diagnosis not present

## 2017-04-23 DIAGNOSIS — Z7982 Long term (current) use of aspirin: Secondary | ICD-10-CM | POA: Insufficient documentation

## 2017-04-23 DIAGNOSIS — Z87891 Personal history of nicotine dependence: Secondary | ICD-10-CM | POA: Insufficient documentation

## 2017-04-23 DIAGNOSIS — Z79899 Other long term (current) drug therapy: Secondary | ICD-10-CM | POA: Insufficient documentation

## 2017-04-23 LAB — BASIC METABOLIC PANEL
Anion gap: 9 (ref 5–15)
BUN: 12 mg/dL (ref 6–20)
CHLORIDE: 105 mmol/L (ref 101–111)
CO2: 25 mmol/L (ref 22–32)
CREATININE: 0.61 mg/dL (ref 0.44–1.00)
Calcium: 9.3 mg/dL (ref 8.9–10.3)
GFR calc Af Amer: >60 mL/min (ref >60)
GFR calc non Af Amer: >60 mL/min (ref >60)
GLUCOSE: 104 mg/dL — AB (ref 65–99)
POTASSIUM: 3.6 mmol/L (ref 3.5–5.1)
Sodium: 139 mmol/L (ref 135–145)

## 2017-04-23 LAB — CBC
HEMATOCRIT: 35.6 % — AB (ref 36.0–46.0)
Hemoglobin: 11.5 g/dL — ABNORMAL LOW (ref 12.0–15.0)
MCH: 26.9 pg (ref 26.0–34.0)
MCHC: 32.3 g/dL (ref 30.0–36.0)
MCV: 83.4 fL (ref 78.0–100.0)
PLATELETS: 311 10*3/uL (ref 150–400)
RBC: 4.27 MIL/uL (ref 3.87–5.11)
RDW: 13.4 % (ref 11.5–15.5)
WBC: 7.4 10*3/uL (ref 4.0–10.5)

## 2017-04-23 LAB — I-STAT TROPONIN, ED
Troponin i, poc: 0 ng/mL (ref 0.00–0.08)
Troponin i, poc: 0 ng/mL (ref 0.00–0.08)

## 2017-04-23 LAB — I-STAT BETA HCG BLOOD, ED (MC, WL, AP ONLY)

## 2017-04-23 MED ORDER — LIDOCAINE 5 % EX PTCH
1.0000 | MEDICATED_PATCH | Freq: Once | CUTANEOUS | Status: DC
  Administered 2017-04-23: 1 via TRANSDERMAL
  Filled 2017-04-23: qty 1

## 2017-04-23 MED ORDER — KETOROLAC TROMETHAMINE 60 MG/2ML IM SOLN
30.0000 mg | Freq: Once | INTRAMUSCULAR | Status: AC
  Administered 2017-04-23: 30 mg via INTRAMUSCULAR
  Filled 2017-04-23: qty 2

## 2017-04-23 MED ORDER — IBUPROFEN 800 MG PO TABS: 800.0000 mg | ORAL_TABLET | Freq: Three times a day (TID) | ORAL | 0 refills | Status: DC

## 2017-04-23 NOTE — ED Triage Notes (Signed)
Pt reports L sided CP onset 1 hr ago, states it is under her breast, 6/10, pressure like. Denies SOB, N/V.

## 2017-04-23 NOTE — ED Notes (Signed)
Patient c/o sudden onset chest pain sharp in nature, states it feels like muscle pain

## 2017-04-23 NOTE — ED Provider Notes (Signed)
[facility] EMERGENCY DEPARTMENT Provider Note   CSN: [id number] Arrival date & time: 04/23/17  0139     History   Chief Complaint Chief Complaint  Patient presents with  . Chest Pain    HPI   Blood pressure 132/84, pulse 69, temperature 98 F (36.7 C), temperature source Oral, resp. rate 16, height 5\' 1"  (1.549 m), weight 72.6 kg (160 lb), SpO2 100 %.  Audrey Rodriguez is a 47 y.o. female has medical history significant for HLD complaining of acute severe, 7 out of 10 sharp left-sided chest pain onset last night at midnight, its exacerbated with movement and position she states it is worse when she went lays on her left side.  She denies any diaphoresis, palpitations, shortness of breath, history of DVT/PE, recent immobilizations, exogenous estrogen, calf pain, leg swelling.  She has not smoked in >10 years.  Her mother had a heart attack at age 79 which she survived.  She was evaluated by a cardiologist several years ago it sounds like she had an echo with no abnormalities, she does not regularly follow with cardiology.  She denies diabetes, hypertension.  There is no exacerbation with exertion.  She took an aspirin at home with little relief.  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  There is been no new movement or exercise regimen.  Past Medical History:  Diagnosis Date  . Anemia   . Anxiety   . GERD (gastroesophageal reflux disease)    no meds  . Heart murmur   . Hypercholesteremia     Patient Active Problem List   Diagnosis Date Noted  . Ganglion cyst of right foot 11/26/2014  . Tendonitis, Achilles, left 11/26/2014  . Left arm pain 11/22/2013  . Iron deficiency anemia 11/22/2013  . Heart murmur previously undiagnosed 11/22/2013  . Fibroids 02/24/2012    Past Surgical History:  Procedure Laterality Date  . CESAREAN SECTION     x 5    OB History    Gravida Para Term Preterm AB Living   5 5       5    SAB TAB Ectopic Multiple  Live Births                   Home Medications    Prior to Admission medications   Medication Sig Start Date End Date Taking? Authorizing Provider  aspirin 325 MG tablet Take 325 mg by mouth daily.   Yes [provider]  simvastatin (ZOCOR) 20 MG tablet Take 20 mg by mouth daily.   Yes [provider]  vitamin B-12 (CYANOCOBALAMIN) 1000 MCG tablet Take 1,000 mcg by mouth daily.   Yes [provider]  Vitamin D, Ergocalciferol, (DRISDOL) 50000 units CAPS capsule Take 50,000 Units by mouth every 7 (seven) days.   Yes [provider]  vitamin E 400 UNIT capsule Take 400 Units by mouth daily.   Yes [provider]  ibuprofen (ADVIL,MOTRIN) 800 MG tablet Take 1 tablet (800 mg total) by mouth 3 (three) times daily. 04/24/17   Pallas Wahlert, Elmyra Ricks, PA-C    Family History Family History  Problem Relation Age of Onset  . Heart disease Mother   . Diabetes Father   . Kidney disease Father     Social History Social History   Tobacco Use  . Smoking status: Former Smoker    Last attempt to quit: 12/28/1997    Years since quitting: 19.3  . Smokeless tobacco: Never Used  Substance Use Topics  . Alcohol use: No  . Drug use: No     Allergies   Patient has no known allergies.   Review of Systems Review of Systems  A complete review of systems was obtained and all systems are negative except as noted in the HPI and PMH.    Physical Exam Updated Vital Signs BP 132/84 (BP Location: Right Arm)   Pulse 69   Temp 98 F (36.7 C) (Oral)   Resp 16   Ht 5\' 1"  (1.549 m)   Wt 72.6 kg (160 lb)   LMP  (LMP Unknown)   SpO2 100%   BMI 30.23 kg/m   Physical Exam  Constitutional: She is oriented to person, place, and time. She appears well-developed and well-nourished. No distress.  HENT:  Head: Normocephalic.  Mouth/Throat: Oropharynx is clear and moist.  Eyes: Conjunctivae are normal.  Neck: Normal range of motion. No JVD present. No tracheal  deviation present.  Cardiovascular: Normal rate, regular rhythm and intact distal pulses.  Radial pulse equal bilaterally  Pulmonary/Chest: Effort normal and breath sounds normal. No stridor. No respiratory distress. She has no wheezes. She has no rales. She exhibits no tenderness.  Abdominal: Soft. She exhibits no distension and no mass. There is no tenderness. There is no rebound and no guarding.  Musculoskeletal: Normal range of motion. She exhibits no edema or tenderness.  No calf asymmetry, superficial collaterals, palpable cords, edema, Homans sign negative bilaterally.    Neurological: She is alert and oriented to person, place, and time.  Skin: Skin is warm. She is not diaphoretic.  Psychiatric: She has a normal mood and affect.  Nursing note and vitals reviewed.    ED Treatments / Results  Labs (all labs ordered are listed, but only abnormal results are displayed) Labs Reviewed  BASIC METABOLIC PANEL - Abnormal; Notable for the following components:      Result Value   Glucose, Bld 104 (*)    All other components within normal limits  CBC - Abnormal; Notable for the following components:   Hemoglobin 11.5 (*)    HCT 35.6 (*)    All other components within normal limits  I-STAT TROPONIN, ED  I-STAT BETA HCG BLOOD, ED ([facility], [facility], [facility] ONLY)  I-STAT TROPONIN, ED    EKG  EKG Interpretation None       Radiology Dg Chest 2 View  Result Date: 04/23/2017 CLINICAL DATA:  Chest pain EXAM: CHEST  2 VIEW COMPARISON:  12/15/2016 chest radiograph. FINDINGS: Stable cardiomediastinal silhouette with normal heart size. No pneumothorax. No pleural effusion. Lungs appear clear, with no acute consolidative airspace disease and no pulmonary edema. IMPRESSION: No active cardiopulmonary disease. Electronically Signed   By: Ilona Sorrel M.D.   On: 04/23/2017 02:41    Procedures Procedures (including critical care time)  Medications Ordered in ED Medications  lidocaine (LIDODERM) 5 % 1  patch (1 patch Transdermal Patch Applied 04/23/17 0638)  ketorolac (TORADOL) injection 30 mg (30 mg Intramuscular Given 04/23/17 3716)     Initial Impression / Assessment and Plan / ED Course  I have reviewed the triage vital signs and the nursing notes.  Pertinent labs & imaging results that were available during my care of the patient were reviewed by me and considered in my medical decision making (see chart for details).    Vitals:   04/23/17 0144 04/23/17 0500  BP: (!) 177/110 132/84  Pulse: 66 69  Resp: 16 16  Temp: 98 F (36.7 C)  35 F (36.7 C)  TempSrc: Oral Oral  SpO2: 100% 100%  Weight: 72.6 kg (160 lb)   Height: 5\' 1"  (1.549 m)     Medications  lidocaine (LIDODERM) 5 % 1 patch (1 patch Transdermal Patch Applied 04/23/17 0638)  ketorolac (TORADOL) injection 30 mg (30 mg Intramuscular Given 04/23/17 3785)    Kristia Jupiter is 47 y.o. female presenting with acute left-sided positional chest pain onset last night.  No associated shortness of breath, palpitations, diaphoresis, exacerbation with exertion.  Although the chest pain is not reproducible I feel it is musculoskeletal in nature.  Patient is low risk by Wells score and PERC negative.  Not consistent with angina.  Patient's EKG is normal, normal troponin, blood work reassuring, chest x-ray negative.  Patient will be given Toradol and Lidoderm patch.  Will check a delta troponin given her family history of ACS.  Delta troponin negative, vital signs remained reassuring.  Pain improved after Toradol.  Will follow closely with primary care.  Evaluation does not show pathology that would require ongoing emergent intervention or inpatient treatment. Pt is hemodynamically stable and mentating appropriately. Discussed findings and plan with patient/guardian, who agrees with care plan. All questions answered. Return precautions discussed and outpatient follow up given.   Final Clinical Impressions(s) / ED Diagnoses   Final  diagnoses:  Chest wall pain    ED Discharge Orders        Ordered    ibuprofen (ADVIL,MOTRIN) 800 MG tablet  3 times daily     04/23/17 0656       Field Staniszewski, Charna Elizabeth 04/23/17 8850    Ward, Delice Bison, DO 04/23/17 0700

## 2017-04-23 NOTE — Discharge Instructions (Signed)
Please follow with your primary care doctor in the next 2 days for a check-up. They must obtain records for further management.  ° °Do not hesitate to return to the Emergency Department for any new, worsening or concerning symptoms.  ° °

## 2017-06-07 ENCOUNTER — Other Ambulatory Visit: Payer: Self-pay | Admitting: Internal Medicine

## 2017-06-07 DIAGNOSIS — Z1231 Encounter for screening mammogram for malignant neoplasm of breast: Secondary | ICD-10-CM

## 2017-06-18 ENCOUNTER — Ambulatory Visit: Payer: Medicaid Other | Admitting: Certified Nurse Midwife

## 2017-06-25 ENCOUNTER — Ambulatory Visit
Admission: RE | Admit: 2017-06-25 | Discharge: 2017-06-25 | Disposition: A | Discharge status: Home / Self Care | Payer: Medicaid Other | Source: Ambulatory Visit | Attending: Internal Medicine | Admitting: Internal Medicine

## 2017-06-25 DIAGNOSIS — Z1231 Encounter for screening mammogram for malignant neoplasm of breast: Secondary | ICD-10-CM

## 2017-07-29 ENCOUNTER — Emergency Department (HOSPITAL_COMMUNITY)
Admission: EM | Admit: 2017-07-29 | Discharge: 2017-07-29 | Disposition: A | Discharge status: Home / Self Care | Payer: Medicaid Other | Attending: Emergency Medicine | Admitting: Emergency Medicine

## 2017-07-29 ENCOUNTER — Encounter (HOSPITAL_COMMUNITY): Payer: Self-pay | Admitting: Emergency Medicine

## 2017-07-29 DIAGNOSIS — Z5321 Procedure and treatment not carried out due to patient leaving prior to being seen by health care provider: Secondary | ICD-10-CM | POA: Diagnosis not present

## 2017-07-29 DIAGNOSIS — R51 Headache: Secondary | ICD-10-CM | POA: Insufficient documentation

## 2017-07-29 NOTE — ED Notes (Signed)
Called Pt in lobby for vital recheck. No response in lobby x2.

## 2017-07-29 NOTE — ED Notes (Signed)
Called for Pt in lobby for vital recheck. No response in lobby x1.

## 2017-07-29 NOTE — ED Notes (Signed)
Called Pt in lobby for vital recheck. No response in lobby from Pt x3.

## 2017-07-29 NOTE — ED Triage Notes (Signed)
Pt c/o right side headache that will radiate posteriorly and been intermittent since yesterday. Denies visual problems, n/v.

## 2017-08-12 ENCOUNTER — Ambulatory Visit: Payer: Medicaid Other | Admitting: Certified Nurse Midwife

## 2017-12-27 ENCOUNTER — Other Ambulatory Visit (HOSPITAL_COMMUNITY)
Admission: RE | Admit: 2017-12-27 | Discharge: 2017-12-27 | Disposition: A | Discharge status: Home / Self Care | Payer: Medicaid Other | Source: Ambulatory Visit | Attending: Obstetrics | Admitting: Obstetrics

## 2017-12-27 ENCOUNTER — Encounter: Payer: Self-pay | Admitting: Obstetrics

## 2017-12-27 ENCOUNTER — Ambulatory Visit (INDEPENDENT_AMBULATORY_CARE_PROVIDER_SITE_OTHER): Payer: Medicaid Other | Admitting: Obstetrics

## 2017-12-27 ENCOUNTER — Ambulatory Visit: Payer: Medicaid Other | Admitting: Obstetrics

## 2017-12-27 ENCOUNTER — Other Ambulatory Visit: Payer: Self-pay

## 2017-12-27 VITALS — BP 147/80 | HR 82 | Ht 61.0 in | Wt 182.6 lb

## 2017-12-27 DIAGNOSIS — Z0000 Encounter for general adult medical examination without abnormal findings: Secondary | ICD-10-CM

## 2017-12-27 DIAGNOSIS — Z01419 Encounter for gynecological examination (general) (routine) without abnormal findings: Secondary | ICD-10-CM | POA: Diagnosis present

## 2017-12-27 DIAGNOSIS — R519 Headache, unspecified: Secondary | ICD-10-CM

## 2017-12-27 DIAGNOSIS — N898 Other specified noninflammatory disorders of vagina: Secondary | ICD-10-CM

## 2017-12-27 DIAGNOSIS — R51 Headache: Secondary | ICD-10-CM

## 2017-12-27 MED ORDER — BUTALBITAL-APAP-CAFFEINE 50-325-40 MG PO TABS: 2.0000 | ORAL_TABLET | Freq: Four times a day (QID) | ORAL | 2 refills | Status: AC | PRN

## 2017-12-27 NOTE — Progress Notes (Signed)
AEX/PAP/Cultures.  Last Mammogram 2019 C/o headache 8/10 started today.

## 2017-12-28 ENCOUNTER — Encounter: Payer: Self-pay | Admitting: Obstetrics

## 2017-12-28 NOTE — Progress Notes (Signed)
Subjective:        Audrey Rodriguez is a 47 y.o. female here for a routine exam.  Current complaints: Headache.    Personal health questionnaire:  Is patient Ashkenazi Jewish, have a family history of breast and/or ovarian cancer: no Is there a family history of uterine cancer diagnosed at age < 51, gastrointestinal cancer, urinary tract cancer, family member who is a Field seismologist syndrome-associated carrier: no Is the patient overweight and hypertensive, family history of diabetes, personal history of gestational diabetes, preeclampsia or PCOS: no Is patient over 48, have PCOS,  family history of premature CHD under age 54, diabetes, smoke, have hypertension or peripheral artery disease:  no At any time, has a partner hit, kicked or otherwise hurt or frightened you?: no Over the past 2 weeks, have you felt down, depressed or hopeless?: no Over the past 2 weeks, have you felt little interest or pleasure in doing things?:no   Gynecologic History No LMP recorded (lmp unknown). Patient is postmenopausal. Contraception: tubal ligation Last Pap: 2017. Results were: normal Last mammogram: 2019. Results were: normal  Obstetric History OB History  Gravida Para Term Preterm AB Living  5 5       5   SAB TAB Ectopic Multiple Live Births               # Outcome Date GA Lbr Len/2nd Weight Sex Delivery Anes PTL Lv  5 Para      CS-Unspec     4 Para      CS-Unspec     3 Para      CS-Unspec     2 Para      CS-Unspec     1 Para      CS-Unspec       Past Medical History:  Diagnosis Date  . Anemia   . Anxiety   . GERD (gastroesophageal reflux disease)    no meds  . Heart murmur   . Hypercholesteremia     Past Surgical History:  Procedure Laterality Date  . CESAREAN SECTION     x 5     Current Outpatient Medications:  .  aspirin 325 MG tablet, Take 325 mg by mouth daily., Disp: , Rfl:  .  butalbital-acetaminophen-caffeine (FIORICET, ESGIC) 50-325-40 MG tablet, Take 2 tablets by mouth every 6  (six) hours as needed for headache., Disp: 30 tablet, Rfl: 2 .  ibuprofen (ADVIL,MOTRIN) 800 MG tablet, Take 1 tablet (800 mg total) by mouth 3 (three) times daily., Disp: 21 tablet, Rfl: 0 .  simvastatin (ZOCOR) 20 MG tablet, Take 20 mg by mouth daily., Disp: , Rfl:  .  vitamin B-12 (CYANOCOBALAMIN) 1000 MCG tablet, Take 1,000 mcg by mouth daily., Disp: , Rfl:  .  Vitamin D, Ergocalciferol, (DRISDOL) 50000 units CAPS capsule, Take 50,000 Units by mouth every 7 (seven) days., Disp: , Rfl:  .  vitamin E 400 UNIT capsule, Take 400 Units by mouth daily., Disp: , Rfl:  No Known Allergies  Social History   Tobacco Use  . Smoking status: Former Smoker    Last attempt to quit: 12/28/1997    Years since quitting: 20.0  . Smokeless tobacco: Never Used  Substance Use Topics  . Alcohol use: No    Family History  Problem Relation Age of Onset  . Heart disease Mother   . Diabetes Father   . Kidney disease Father       Review of Systems  Constitutional: negative for fatigue and weight loss Respiratory:  negative for cough and wheezing Cardiovascular: negative for chest pain, fatigue and palpitations Gastrointestinal: negative for abdominal pain and change in bowel habits Musculoskeletal:negative for myalgias Neurological: negative for gait problems and tremors Behavioral/Psych: negative for abusive relationship, depression Endocrine: negative for temperature intolerance    Genitourinary:negative for abnormal menstrual periods, genital lesions, hot flashes, sexual problems and vaginal discharge Integument/breast: negative for breast lump, breast tenderness, nipple discharge and skin lesion(s)    Objective:       BP (!) 147/80   Pulse 82   Ht 5\' 1"  (1.549 m)   Wt 182 lb 9.6 oz (82.8 kg)   LMP  (LMP Unknown)   BMI 34.50 kg/m  General:   alert  Skin:   no rash or abnormalities  Lungs:   clear to auscultation bilaterally  Heart:   regular rate and rhythm, S1, S2 normal, no murmur,  click, rub or gallop  Breasts:   normal without suspicious masses, skin or nipple changes or axillary nodes  Abdomen:  normal findings: no organomegaly, soft, non-tender and no hernia  Pelvis:  External genitalia: normal general appearance Urinary system: urethral meatus normal and bladder without fullness, nontender Vaginal: normal without tenderness, induration or masses Cervix: normal appearance Adnexa: normal bimanual exam Uterus: anteverted and non-tender, normal size   Lab Review Urine pregnancy test Labs reviewed yes Radiologic studies reviewed yes  50% of 20 min visit spent on counseling and coordination of care.   Assessment:     1. Encounter for routine gynecological examination with Papanicolaou smear of cervix Rx: - Cytology - PAP  2. Vaginal discharge Rx: - Cervicovaginal ancillary only  3. Intractable episodic headache, unspecified headache type Rx: - butalbital-acetaminophen-caffeine (FIORICET, ESGIC) 50-325-40 MG tablet; Take 2 tablets by mouth every 6 (six) hours as needed for headache.  Dispense: 30 tablet; Refill: 2    Plan:    Education reviewed: calcium supplements, depression evaluation, low fat, low cholesterol diet, safe sex/STD prevention, self breast exams and weight bearing exercise. Follow up in: 1 year.   Meds ordered this encounter  Medications  . butalbital-acetaminophen-caffeine (FIORICET, ESGIC) 50-325-40 MG tablet    Sig: Take 2 tablets by mouth every 6 (six) hours as needed for headache.    Dispense:  30 tablet    Refill:  2   No orders of the defined types were placed in this encounter.   Shelly Bombard MD 12-28-2017

## 2017-12-29 LAB — CERVICOVAGINAL ANCILLARY ONLY
Bacterial vaginitis: POSITIVE — AB
Candida vaginitis: POSITIVE — AB
Chlamydia: NEGATIVE
Neisseria Gonorrhea: NEGATIVE
Trichomonas: NEGATIVE

## 2017-12-30 LAB — CYTOLOGY - PAP
DIAGNOSIS: NEGATIVE
HPV (WINDOPATH): NOT DETECTED

## 2018-11-24 DIAGNOSIS — N3080 Other cystitis without hematuria: Secondary | ICD-10-CM | POA: Diagnosis not present

## 2018-11-24 DIAGNOSIS — E7849 Other hyperlipidemia: Secondary | ICD-10-CM | POA: Diagnosis not present

## 2018-11-24 DIAGNOSIS — J302 Other seasonal allergic rhinitis: Secondary | ICD-10-CM | POA: Diagnosis not present

## 2018-11-24 DIAGNOSIS — K219 Gastro-esophageal reflux disease without esophagitis: Secondary | ICD-10-CM | POA: Diagnosis not present

## 2019-02-03 ENCOUNTER — Telehealth (INDEPENDENT_AMBULATORY_CARE_PROVIDER_SITE_OTHER): Payer: Medicaid Other | Admitting: Podiatry

## 2019-02-03 ENCOUNTER — Encounter: Payer: Self-pay | Admitting: Podiatry

## 2019-02-03 DIAGNOSIS — M7752 Other enthesopathy of left foot: Secondary | ICD-10-CM

## 2019-02-03 DIAGNOSIS — M779 Enthesopathy, unspecified: Secondary | ICD-10-CM

## 2019-02-03 DIAGNOSIS — R609 Edema, unspecified: Secondary | ICD-10-CM

## 2019-02-03 MED ORDER — IBUPROFEN 800 MG PO TABS
800.0000 mg | ORAL_TABLET | Freq: Three times a day (TID) | ORAL | 0 refills | Status: DC | PRN
Start: 2019-02-03 — End: 2020-01-24

## 2019-02-03 NOTE — Progress Notes (Signed)
Virtual Visit via Telephone Note  I connected with Audrey Rodriguez on 02/03/19 at 12:30 PM EDT by telephone and verified that I am speaking with the correct person using two identifiers.  Location: Patient: Writer: Office   I discussed the limitations, risks, security and privacy concerns of performing an evaluation and management service by telephone and the availability of in person appointments. I also discussed with the patient that there may be a patient responsible charge related to this service. The patient expressed understanding and agreed to proceed.   History of Present Illness: 48 year old female states that she is still having problems with swelling to both of her ankles.  She states that left-sided slightly worse than the right.  She said that she gets swelling discomfort in the morning when she first gets up under walking it does improve.  She denies any heel pain or any radiating pain that she states is in the ankles.  She states that she denies any treatment over the last couple years and she denies any recent injury or falls.  She denies any redness or warmth.  She has no claudication symptoms.  No recent travel.  She denies any chest pain, shortness of breath.  No fevers or chills.   Observations/Objective: Upon evaluation she states that there is no plan I addressed this with her.  She states that more the discomfort to the lateral aspect of it ankle as opposed to the medial.  The left side is worse than the right.  She is able to move her ankle joint.  Assessment and Plan: Bilateral lower extremity swelling, capsulitis I discussed with her is difficult to fully evaluate over the phone.  However in order venous reflux study given the chronic swelling.  Also prescribed ibuprofen 800 mg to take as needed.  Discussed stretching, rehab exercises as well.  Once we get the venous reflux study I will see her back in the office.  Follow Up Instructions: See above   I discussed  the assessment and treatment plan with the patient. The patient was provided an opportunity to ask questions and all were answered. The patient agreed with the plan and demonstrated an understanding of the instructions.   The patient was advised to call back or seek an in-person evaluation if the symptoms worsen or if the condition fails to improve as anticipated.  I provided 14 minutes of non-face-to-face time during this encounter.   Trula Slade, DPM

## 2019-02-06 ENCOUNTER — Telehealth: Payer: Self-pay | Admitting: *Deleted

## 2019-02-06 DIAGNOSIS — R609 Edema, unspecified: Secondary | ICD-10-CM

## 2019-02-06 NOTE — Telephone Encounter (Signed)
-----   Message from Trula Slade, DPM sent at 02/03/2019  2:00 PM EDT ----- Can you please order a venous reflux study due to swelling bilaterally? Thanks.

## 2019-02-06 NOTE — Telephone Encounter (Signed)
Entered in error

## 2019-02-06 NOTE — Telephone Encounter (Signed)
[facility] - MEDICAID AUTHORIZATIONFA:[id number], EFFECTIVE:  02/06/2019, END:  08/05/2019 FOR VENOUS REFLUX B/L [id number]. Faxed to [facility].

## 2019-02-20 ENCOUNTER — Inpatient Hospital Stay (HOSPITAL_COMMUNITY): Admission: RE | Admit: 2019-02-20 | Payer: Medicaid Other | Source: Ambulatory Visit

## 2019-03-06 ENCOUNTER — Other Ambulatory Visit: Payer: Self-pay

## 2019-03-06 ENCOUNTER — Ambulatory Visit (HOSPITAL_COMMUNITY)
Admission: RE | Admit: 2019-03-06 | Discharge: 2019-03-06 | Disposition: A | Discharge status: Home / Self Care | Payer: Medicaid Other | Source: Ambulatory Visit | Attending: Internal Medicine | Admitting: Internal Medicine

## 2019-03-06 DIAGNOSIS — R600 Localized edema: Secondary | ICD-10-CM | POA: Diagnosis not present

## 2019-03-06 DIAGNOSIS — R609 Edema, unspecified: Secondary | ICD-10-CM | POA: Diagnosis not present

## 2019-03-08 ENCOUNTER — Telehealth: Payer: Self-pay | Admitting: *Deleted

## 2019-03-08 NOTE — Telephone Encounter (Signed)
Unable to leave a message pt's voicemail box is full.

## 2019-03-08 NOTE — Telephone Encounter (Signed)
-----   Message from Trula Slade, DPM sent at 03/07/2019  6:15 PM EST ----- Val- can you put in for a vein consult due to swelling and chronic venous insufficiency in the left? Can you do [employer]. Thanks.

## 2019-03-10 ENCOUNTER — Telehealth: Payer: Self-pay | Admitting: *Deleted

## 2019-03-10 DIAGNOSIS — R609 Edema, unspecified: Secondary | ICD-10-CM

## 2019-03-10 NOTE — Telephone Encounter (Signed)
-----   Message from Trula Slade, DPM sent at 03/07/2019  6:15 PM EST ----- Val- can you put in for a vein consult due to swelling and chronic venous insufficiency in the left? Can you do [employer]. Thanks.

## 2019-03-10 NOTE — Telephone Encounter (Signed)
Please refer to [facility]

## 2019-03-13 NOTE — Telephone Encounter (Signed)
Faxed required referral form, Epic form, clinicals and demographics to [facility].

## 2019-03-14 ENCOUNTER — Other Ambulatory Visit: Payer: Self-pay | Admitting: Internal Medicine

## 2019-03-14 DIAGNOSIS — Z1231 Encounter for screening mammogram for malignant neoplasm of breast: Secondary | ICD-10-CM

## 2019-04-11 ENCOUNTER — Telehealth: Payer: Self-pay | Admitting: *Deleted

## 2019-04-11 DIAGNOSIS — R609 Edema, unspecified: Secondary | ICD-10-CM

## 2019-04-11 NOTE — Telephone Encounter (Signed)
I spoke with pt and I had been unable to leave a message but had made a referral to [facility] due to the veins in the leg and structures of the leg were not able to push the fluid from the leg and keep it out of the so it just stayed in the leg rather than being pushed out to the circulation to be expelled by the body. Pt states she already had an appt with [facility], but just wanted to know why she had swelling.

## 2019-04-11 NOTE — Telephone Encounter (Signed)
Pt called for results.

## 2019-04-12 NOTE — Telephone Encounter (Signed)
I spoke with pt and she states she would like a referral in [place], [place].

## 2019-04-12 NOTE — Telephone Encounter (Signed)
Pt called left name and phone number only.

## 2019-04-18 NOTE — Addendum Note (Signed)
Addended by: Harriett Sine D on: 04/18/2019 09:26 AM   Modules accepted: Orders

## 2019-04-18 NOTE — Telephone Encounter (Signed)
Faxed referral to [facility] per pt requested location.

## 2019-05-01 NOTE — Telephone Encounter (Signed)
I called pt and informed that Audrey Rodriguez referral had been made 04/18/2019 and informed of their contact number [phone].

## 2019-05-01 NOTE — Telephone Encounter (Signed)
Pt called to see if she had been referred to the vein doctor in [place].

## 2019-06-28 ENCOUNTER — Telehealth: Payer: Self-pay | Admitting: *Deleted

## 2019-06-28 DIAGNOSIS — R609 Edema, unspecified: Secondary | ICD-10-CM

## 2019-06-28 NOTE — Telephone Encounter (Signed)
[facility] fax [phone] is in error. Faxed to [facility] [phone].

## 2019-06-28 NOTE — Telephone Encounter (Signed)
Pt called for the number to [facility].

## 2019-06-28 NOTE — Telephone Encounter (Signed)
I spoke with pt and informed that her referral was to a vein and vascular doctor in [place]. Pt states she doesn't want to go to the doctor in [place], wants to go to the one we referred to before. Faxed new referral to [facility].

## 2019-07-12 ENCOUNTER — Ambulatory Visit
Admission: RE | Admit: 2019-07-12 | Discharge: 2019-07-12 | Disposition: A | Discharge status: Home / Self Care | Payer: Medicaid Other | Source: Ambulatory Visit | Attending: Internal Medicine | Admitting: Internal Medicine

## 2019-07-12 ENCOUNTER — Other Ambulatory Visit: Payer: Self-pay

## 2019-07-12 DIAGNOSIS — Z1231 Encounter for screening mammogram for malignant neoplasm of breast: Secondary | ICD-10-CM

## 2019-07-13 DIAGNOSIS — E559 Vitamin D deficiency, unspecified: Secondary | ICD-10-CM | POA: Diagnosis not present

## 2019-07-13 DIAGNOSIS — E2839 Other primary ovarian failure: Secondary | ICD-10-CM | POA: Diagnosis not present

## 2019-07-13 DIAGNOSIS — R7303 Prediabetes: Secondary | ICD-10-CM | POA: Diagnosis not present

## 2019-07-13 DIAGNOSIS — Z124 Encounter for screening for malignant neoplasm of cervix: Secondary | ICD-10-CM | POA: Diagnosis not present

## 2019-07-13 DIAGNOSIS — Z1211 Encounter for screening for malignant neoplasm of colon: Secondary | ICD-10-CM | POA: Diagnosis not present

## 2019-07-13 DIAGNOSIS — E7849 Other hyperlipidemia: Secondary | ICD-10-CM | POA: Diagnosis not present

## 2019-07-13 DIAGNOSIS — Z0000 Encounter for general adult medical examination without abnormal findings: Secondary | ICD-10-CM | POA: Diagnosis not present

## 2019-07-13 DIAGNOSIS — N3001 Acute cystitis with hematuria: Secondary | ICD-10-CM | POA: Diagnosis not present

## 2019-07-14 ENCOUNTER — Other Ambulatory Visit: Payer: Self-pay | Admitting: Internal Medicine

## 2019-07-14 DIAGNOSIS — E2839 Other primary ovarian failure: Secondary | ICD-10-CM

## 2019-07-17 ENCOUNTER — Ambulatory Visit: Payer: Medicaid Other

## 2019-08-03 ENCOUNTER — Ambulatory Visit: Payer: Medicaid Other | Admitting: Obstetrics

## 2019-08-18 ENCOUNTER — Encounter: Payer: Medicaid Other | Admitting: Vascular Surgery

## 2019-08-18 ENCOUNTER — Encounter (HOSPITAL_COMMUNITY): Payer: Medicaid Other

## 2019-08-23 ENCOUNTER — Other Ambulatory Visit: Payer: Self-pay | Admitting: *Deleted

## 2019-08-23 DIAGNOSIS — I872 Venous insufficiency (chronic) (peripheral): Secondary | ICD-10-CM

## 2019-08-31 ENCOUNTER — Telehealth (HOSPITAL_COMMUNITY): Payer: Self-pay

## 2019-08-31 ENCOUNTER — Ambulatory Visit: Payer: Medicaid Other | Admitting: Obstetrics

## 2019-08-31 NOTE — Telephone Encounter (Signed)

## 2019-09-01 ENCOUNTER — Ambulatory Visit: Payer: Medicaid Other | Admitting: Obstetrics

## 2019-09-01 ENCOUNTER — Ambulatory Visit (HOSPITAL_COMMUNITY)
Admission: RE | Admit: 2019-09-01 | Discharge: 2019-09-01 | Disposition: A | Discharge status: Home / Self Care | Payer: Medicaid Other | Source: Ambulatory Visit | Attending: Vascular Surgery | Admitting: Vascular Surgery

## 2019-09-01 ENCOUNTER — Other Ambulatory Visit: Payer: Self-pay

## 2019-09-01 ENCOUNTER — Ambulatory Visit (INDEPENDENT_AMBULATORY_CARE_PROVIDER_SITE_OTHER): Payer: Medicaid Other | Admitting: Vascular Surgery

## 2019-09-01 ENCOUNTER — Encounter: Payer: Self-pay | Admitting: Obstetrics

## 2019-09-01 ENCOUNTER — Encounter: Payer: Self-pay | Admitting: Vascular Surgery

## 2019-09-01 ENCOUNTER — Other Ambulatory Visit (HOSPITAL_COMMUNITY)
Admission: RE | Admit: 2019-09-01 | Discharge: 2019-09-01 | Disposition: A | Discharge status: Home / Self Care | Payer: Medicaid Other | Source: Ambulatory Visit | Attending: Obstetrics | Admitting: Obstetrics

## 2019-09-01 VITALS — BP 135/87 | HR 61 | Temp 97.6°F | Resp 20 | Ht 61.0 in | Wt 170.0 lb

## 2019-09-01 VITALS — BP 144/85 | HR 69 | Wt 170.8 lb

## 2019-09-01 DIAGNOSIS — Z0000 Encounter for general adult medical examination without abnormal findings: Secondary | ICD-10-CM

## 2019-09-01 DIAGNOSIS — Z01419 Encounter for gynecological examination (general) (routine) without abnormal findings: Secondary | ICD-10-CM | POA: Insufficient documentation

## 2019-09-01 DIAGNOSIS — N898 Other specified noninflammatory disorders of vagina: Secondary | ICD-10-CM

## 2019-09-01 DIAGNOSIS — M7989 Other specified soft tissue disorders: Secondary | ICD-10-CM

## 2019-09-01 DIAGNOSIS — Z1211 Encounter for screening for malignant neoplasm of colon: Secondary | ICD-10-CM | POA: Diagnosis not present

## 2019-09-01 DIAGNOSIS — I872 Venous insufficiency (chronic) (peripheral): Secondary | ICD-10-CM

## 2019-09-01 DIAGNOSIS — Z113 Encounter for screening for infections with a predominantly sexual mode of transmission: Secondary | ICD-10-CM

## 2019-09-01 NOTE — Progress Notes (Signed)
Pt is here for annual gyn exam. Last pap 12/27/2017, normal. Last MMG 07/12/19, normal. Bone density scan scheduled for 10/11/19.

## 2019-09-01 NOTE — Progress Notes (Signed)
Subjective:        Audrey Rodriguez is a 49 y.o. female here for a routine exam.  Current complaints: None.    Personal health questionnaire:  Is patient Ashkenazi Jewish, have a family history of breast and/or ovarian cancer: no Is there a family history of uterine cancer diagnosed at age < 25, gastrointestinal cancer, urinary tract cancer, family member who is a Field seismologist syndrome-associated carrier: no Is the patient overweight and hypertensive, family history of diabetes, personal history of gestational diabetes, preeclampsia or PCOS: yes Is patient over 40, have PCOS,  family history of premature CHD under age 79, diabetes, smoke, have hypertension or peripheral artery disease:  no At any time, has a partner hit, kicked or otherwise hurt or frightened you?: no Over the past 2 weeks, have you felt down, depressed or hopeless?: no Over the past 2 weeks, have you felt little interest or pleasure in doing things?:no   Gynecologic History Patient's last menstrual period was 08/22/2019 (approximate). Contraception: tubal ligation Last Pap: 2019. Results were: normal Last mammogram: 2021. Results were: normal  Obstetric History OB History  Gravida Para Term Preterm AB Living  5 5       5   SAB TAB Ectopic Multiple Live Births               # Outcome Date GA Lbr Len/2nd Weight Sex Delivery Anes PTL Lv  5 Para      CS-Unspec     4 Para      CS-Unspec     3 Para      CS-Unspec     2 Para      CS-Unspec     1 Para      CS-Unspec       Past Medical History:  Diagnosis Date  . Anemia   . Anxiety   . GERD (gastroesophageal reflux disease)    no meds  . Heart murmur   . Hypercholesteremia     Past Surgical History:  Procedure Laterality Date  . CESAREAN SECTION     x 5     Current Outpatient Medications:  .  aspirin 325 MG tablet, Take 325 mg by mouth daily., Disp: , Rfl:  .  ibuprofen (ADVIL) 800 MG tablet, Take 1 tablet (800 mg total) by mouth every 8 (eight) hours as  needed., Disp: 30 tablet, Rfl: 0 .  simvastatin (ZOCOR) 20 MG tablet, Take 20 mg by mouth daily., Disp: , Rfl:  .  vitamin B-12 (CYANOCOBALAMIN) 1000 MCG tablet, Take 1,000 mcg by mouth daily., Disp: , Rfl:  .  vitamin E 400 UNIT capsule, Take 400 Units by mouth daily., Disp: , Rfl:  .  Vitamin D, Ergocalciferol, (DRISDOL) 50000 units CAPS capsule, Take 50,000 Units by mouth every 7 (seven) days., Disp: , Rfl:  No Known Allergies  Social History   Tobacco Use  . Smoking status: Former Smoker    Quit date: 12/28/1997    Years since quitting: 21.6  . Smokeless tobacco: Never Used  Substance Use Topics  . Alcohol use: No    Family History  Problem Relation Age of Onset  . Heart disease Mother   . Diabetes Father   . Kidney disease Father       Review of Systems  Constitutional: negative for fatigue and weight loss Respiratory: negative for cough and wheezing Cardiovascular: negative for chest pain, fatigue and palpitations Gastrointestinal: negative for abdominal pain and change in bowel habits Musculoskeletal:negative for myalgias Neurological:  negative for gait problems and tremors Behavioral/Psych: negative for abusive relationship, depression Endocrine: negative for temperature intolerance    Genitourinary:negative for abnormal menstrual periods, genital lesions, hot flashes, sexual problems and vaginal discharge Integument/breast: negative for breast lump, breast tenderness, nipple discharge and skin lesion(s)    Objective:       BP (!) 144/85   Pulse 69   Wt 170 lb 12.8 oz (77.5 kg)   LMP 08/22/2019 (Approximate)   BMI 32.27 kg/m  General:   alert  Skin:   no rash or abnormalities  Lungs:   clear to auscultation bilaterally  Heart:   regular rate and rhythm, S1, S2 normal, no murmur, click, rub or gallop  Breasts:   normal without suspicious masses, skin or nipple changes or axillary nodes  Abdomen:  normal findings: no organomegaly, soft, non-tender and no  hernia  Pelvis:  External genitalia: normal general appearance Urinary system: urethral meatus normal and bladder without fullness, nontender Vaginal: normal without tenderness, induration or masses Cervix: normal appearance Adnexa: normal bimanual exam Uterus: anteverted and non-tender, normal size   Lab Review Urine pregnancy test Labs reviewed yes Radiologic studies reviewed yes  50% of 20 min visit spent on counseling and coordination of care.   Assessment:     1. Encounter for gynecological examination with Papanicolaou smear of cervix Rx: - Cytology - PAP( [place])  2. Vaginal discharge Rx: - Cervicovaginal ancillary only( [place])  3. Screen for STD (sexually transmitted disease) Rx: - Hepatitis B surface antigen - Hepatitis C antibody - HIV Antibody (routine testing w rflx) - RPR  4. Screening for colon cancer Rx: - Ambulatory referral to Gastroenterology   Plan:    Education reviewed: calcium supplements, depression evaluation, low fat, low cholesterol diet, safe sex/STD prevention, self breast exams and weight bearing exercise. Follow up in: 1 year.    Orders Placed This Encounter  Procedures  . Hepatitis B surface antigen  . Hepatitis C antibody  . HIV Antibody (routine testing w rflx)  . RPR  . Ambulatory referral to Gastroenterology    Referral Priority:   Routine    Referral Type:   Consultation    Referral Reason:   Specialty Services Required    Number of Visits Requested:   1    Shelly Bombard, MD 09/01/2019 11:36 AM

## 2019-09-01 NOTE — Progress Notes (Signed)
Patient ID: Audrey Rodriguez, female   DOB: Oct 21, 1970, 49 y.o.   MRN: [id number]  Reason for Consult: New Patient (Initial Visit)   Referred by Trula Slade, DPM  Subjective:     HPI:  Audrey Rodriguez is a 49 y.o. female presents for bilateral lower extremity ankle swelling and pain.  She has never had blood clot.  She does have a history of traumatic injury to her right lateral knee this was when she was 49 years old.  She has no tissue loss or ulceration.  Is never had venous intervention to her knowledge.  She does not take blood thinners.  Is no arterial disease does have risk factors of hypercholesterolemia and is a former smoker quitting over 20 years ago.  Past Medical History:  Diagnosis Date  . Anemia   . Anxiety   . GERD (gastroesophageal reflux disease)    no meds  . Heart murmur   . Hypercholesteremia    Family History  Problem Relation Age of Onset  . Heart disease Mother   . Diabetes Father   . Kidney disease Father    Past Surgical History:  Procedure Laterality Date  . CESAREAN SECTION     x 5    Short Social History:  Social History   Tobacco Use  . Smoking status: Former Smoker    Quit date: 12/28/1997    Years since quitting: 21.6  . Smokeless tobacco: Never Used  Substance Use Topics  . Alcohol use: No    No Known Allergies  Current Outpatient Medications  Medication Sig Dispense Refill  . aspirin 325 MG tablet Take 325 mg by mouth daily.    Marland Kitchen ibuprofen (ADVIL) 800 MG tablet Take 1 tablet (800 mg total) by mouth every 8 (eight) hours as needed. 30 tablet 0  . simvastatin (ZOCOR) 20 MG tablet Take 20 mg by mouth daily.    . vitamin B-12 (CYANOCOBALAMIN) 1000 MCG tablet Take 1,000 mcg by mouth daily.    . Vitamin D, Ergocalciferol, (DRISDOL) 50000 units CAPS capsule Take 50,000 Units by mouth every 7 (seven) days.    . vitamin E 400 UNIT capsule Take 400 Units by mouth daily.     No current facility-administered medications for this visit.     Review of Systems  Constitutional:  Constitutional negative. HENT: HENT negative.  Eyes: Eyes negative.  Cardiovascular: Positive for leg swelling.  GI: Gastrointestinal negative.  Musculoskeletal: Positive for leg pain.  Hematologic: Hematologic/lymphatic negative.  Psychiatric: Psychiatric negative.        Objective:  Objective   Vitals:   09/01/19 1402  BP: 135/87  Pulse: 61  Resp: 20  Temp: 97.6 F (36.4 C)  SpO2: 98%  Weight: 170 lb (77.1 kg)  Height: 5\' 1"  (1.549 m)   Body mass index is 32.12 kg/m.  Physical Exam HENT:     Nose:     Comments: Mask in place Eyes:     Pupils: Pupils are equal, round, and reactive to light.  Neck:     Vascular: No carotid bruit.  Cardiovascular:     Rate and Rhythm: Normal rate.     Pulses: Normal pulses.  Pulmonary:     Effort: Pulmonary effort is normal.  Abdominal:     General: Abdomen is flat.  Musculoskeletal:        General: No swelling. Normal range of motion.  Skin:    General: Skin is warm and dry.     Capillary Refill: Capillary refill  takes less than 2 seconds.  Neurological:     General: No focal deficit present.     Mental Status: She is alert.  Psychiatric:        Mood and Affect: Mood normal.        Behavior: Behavior normal.        Thought Content: Thought content normal.        Judgment: Judgment normal.     Data: Venous Reflux Summary:  Right:  - No evidence of deep vein thrombosis seen in the right lower extremity,  from the common femoral through the popliteal veins.  - No evidence of superficial venous thrombosis in the right lower  extremity.  - There is no evidence of venous reflux seen in the right lower extremity.    Left:  - No evidence of deep vein thrombosis seen in the left lower extremity,  from the common femoral through the popliteal veins.  - No evidence of superficial venous reflux seen in the left greater  saphenous vein.  - No evidence of superficial venous reflux  seen in the left short  saphenous vein.  - Venous reflux is noted in the left sapheno-femoral junction.      Assessment/Plan:     49 year old female with bilateral lower extremity ankle swelling.  She has no significant reflux of her bilateral lower extremity greater saphenous veins.  She has no skin changes.  She does not require any vascular invention at this time and can follow-up on an as-needed basis.     Waynetta Sandy MD [facility]

## 2019-09-02 LAB — HIV ANTIBODY (ROUTINE TESTING W REFLEX): HIV Screen 4th Generation wRfx: NONREACTIVE

## 2019-09-02 LAB — HEPATITIS B SURFACE ANTIGEN: Hepatitis B Surface Ag: NEGATIVE

## 2019-09-02 LAB — RPR: RPR Ser Ql: NONREACTIVE

## 2019-09-02 LAB — HEPATITIS C ANTIBODY: Hep C Virus Ab: 0.4 s/co ratio (ref 0.0–0.9)

## 2019-09-06 LAB — CERVICOVAGINAL ANCILLARY ONLY
Bacterial Vaginitis (gardnerella): NEGATIVE
Candida Glabrata: NEGATIVE
Candida Vaginitis: NEGATIVE
Chlamydia: NEGATIVE
Comment: NEGATIVE
Comment: NEGATIVE
Comment: NEGATIVE
Comment: NEGATIVE
Comment: NEGATIVE
Comment: NORMAL
Neisseria Gonorrhea: NEGATIVE
Trichomonas: NEGATIVE

## 2019-09-07 LAB — CYTOLOGY - PAP
Comment: NEGATIVE
Diagnosis: NEGATIVE
High risk HPV: NEGATIVE

## 2019-09-18 ENCOUNTER — Encounter: Payer: Self-pay | Admitting: Gastroenterology

## 2019-10-04 ENCOUNTER — Other Ambulatory Visit: Payer: Medicaid Other

## 2019-10-05 DIAGNOSIS — Z419 Encounter for procedure for purposes other than remedying health state, unspecified: Secondary | ICD-10-CM | POA: Diagnosis not present

## 2019-10-11 ENCOUNTER — Other Ambulatory Visit: Payer: Medicaid Other

## 2019-10-12 DIAGNOSIS — R7303 Prediabetes: Secondary | ICD-10-CM | POA: Diagnosis not present

## 2019-10-12 DIAGNOSIS — E669 Obesity, unspecified: Secondary | ICD-10-CM | POA: Diagnosis not present

## 2019-10-12 DIAGNOSIS — Z6832 Body mass index (BMI) 32.0-32.9, adult: Secondary | ICD-10-CM | POA: Diagnosis not present

## 2019-10-12 DIAGNOSIS — L723 Sebaceous cyst: Secondary | ICD-10-CM | POA: Diagnosis not present

## 2019-10-12 DIAGNOSIS — E7849 Other hyperlipidemia: Secondary | ICD-10-CM | POA: Diagnosis not present

## 2019-10-12 DIAGNOSIS — J302 Other seasonal allergic rhinitis: Secondary | ICD-10-CM | POA: Diagnosis not present

## 2019-10-12 DIAGNOSIS — D511 Vitamin B12 deficiency anemia due to selective vitamin B12 malabsorption with proteinuria: Secondary | ICD-10-CM | POA: Diagnosis not present

## 2019-11-01 ENCOUNTER — Other Ambulatory Visit: Payer: Self-pay

## 2019-11-01 ENCOUNTER — Ambulatory Visit (AMBULATORY_SURGERY_CENTER): Payer: Self-pay | Admitting: *Deleted

## 2019-11-01 VITALS — Ht 61.0 in | Wt 172.0 lb

## 2019-11-01 DIAGNOSIS — Z1211 Encounter for screening for malignant neoplasm of colon: Secondary | ICD-10-CM

## 2019-11-01 DIAGNOSIS — Z01818 Encounter for other preprocedural examination: Secondary | ICD-10-CM

## 2019-11-01 MED ORDER — NA SULFATE-K SULFATE-MG SULF 17.5-3.13-1.6 GM/177ML PO SOLN: 1.0000 | Freq: Once | ORAL | 0 refills | Status: AC

## 2019-11-01 NOTE — Progress Notes (Signed)
Pt has not received covid vaccine.  Covid test scheduled for Thursday 11/09/19 @ 3:00 pm  No egg or soy allergy known to patient  No issues with past sedation with any surgeries or procedures no intubation problems in the past  No diet pills per patient No home 02 use per patient  No blood thinners per patient  Pt denies issues with constipation  No A fib or A flutter  EMMI video to pt or MyChart  COVID 19 guidelines implemented in PV today     Due to the COVID-19 pandemic we are asking patients to follow these guidelines. Please only bring one care partner. Please be aware that your care partner may wait in the car in the parking lot or if they feel like they will be too hot to wait in the car, they may wait in the lobby on the 4th floor. All care partners are required to wear a mask the entire time (we do not have any that we can provide them), they need to practice social distancing, and we will do a Covid check for all patient's and care partners when you arrive. Also we will check their temperature and your temperature. If the care partner waits in their car they need to stay in the parking lot the entire time and we will call them on their cell phone when the patient is ready for discharge so they can bring the car to the front of the building. Also all patient's will need to wear a mask into building.

## 2019-11-05 DIAGNOSIS — Z419 Encounter for procedure for purposes other than remedying health state, unspecified: Secondary | ICD-10-CM | POA: Diagnosis not present

## 2019-11-13 ENCOUNTER — Other Ambulatory Visit (HOSPITAL_COMMUNITY)
Admission: RE | Admit: 2019-11-13 | Discharge: 2019-11-13 | Disposition: A | Discharge status: Home / Self Care | Payer: Medicaid Other | Source: Ambulatory Visit | Attending: Gastroenterology | Admitting: Gastroenterology

## 2019-11-13 ENCOUNTER — Telehealth: Payer: Self-pay | Admitting: Gastroenterology

## 2019-11-13 DIAGNOSIS — Z01812 Encounter for preprocedural laboratory examination: Secondary | ICD-10-CM | POA: Insufficient documentation

## 2019-11-13 DIAGNOSIS — Z20822 Contact with and (suspected) exposure to covid-19: Secondary | ICD-10-CM | POA: Insufficient documentation

## 2019-11-13 LAB — SARS CORONAVIRUS 2 (TAT 6-24 HRS): SARS Coronavirus 2: NEGATIVE

## 2019-11-13 NOTE — Telephone Encounter (Signed)
Patient is scheduled to see Dr. Silverio Decamp on 11/14/19 at 1030. Pt stated that she ate rice and chicken today. Tried to contact patient, but was unable to reach her. Left a voicemail to have patient call Davis back.

## 2019-11-13 NOTE — Telephone Encounter (Signed)
Pt rescheduled procedure to 01/05/20.

## 2019-11-14 ENCOUNTER — Encounter: Payer: Medicaid Other | Admitting: Gastroenterology

## 2019-12-06 DIAGNOSIS — Z419 Encounter for procedure for purposes other than remedying health state, unspecified: Secondary | ICD-10-CM | POA: Diagnosis not present

## 2019-12-19 ENCOUNTER — Telehealth: Payer: Self-pay

## 2019-12-19 NOTE — Telephone Encounter (Signed)
Pt NS for PV.  Attempted to reach pt to reschedule PV.  Left message to call back by 5 PM today to prevent procedure from being cancelled.

## 2019-12-19 NOTE — Telephone Encounter (Signed)
Pt did not return call.  NS letter sent with all procedure cancelled until pt reschedules.

## 2019-12-19 NOTE — Telephone Encounter (Signed)
After note made pt rescheduled PV to 12/28/19.  NS letter not mailed.

## 2019-12-28 ENCOUNTER — Ambulatory Visit (AMBULATORY_SURGERY_CENTER): Payer: Self-pay | Admitting: *Deleted

## 2019-12-28 ENCOUNTER — Other Ambulatory Visit: Payer: Self-pay

## 2019-12-28 VITALS — Ht 61.0 in | Wt 174.0 lb

## 2019-12-28 DIAGNOSIS — Z1211 Encounter for screening for malignant neoplasm of colon: Secondary | ICD-10-CM

## 2019-12-28 DIAGNOSIS — Z01818 Encounter for other preprocedural examination: Secondary | ICD-10-CM

## 2019-12-28 NOTE — Progress Notes (Signed)
cov test 9-30/ 10 am  No egg or soy allergy known to patient  No issues with past sedation with any surgeries or procedures no intubation problems in the past  No FH of Malignant Hyperthermia No diet pills per patient No home 02 use per patient  No blood thinners per patient  Pt denies issues with constipation  No A fib or A flutter  EMMI video to pt or via [place] 19 guidelines implemented in PV today with Pt and RN   Pt has a suprep at home from previous PV 10-2019  Due to the COVID-19 pandemic we are asking patients to follow these guidelines. Please only bring one care partner. Please be aware that your care partner may wait in the car in the parking lot or if they feel like they will be too hot to wait in the car, they may wait in the lobby on the 4th floor. All care partners are required to wear a mask the entire time (we do not have any that we can provide them), they need to practice social distancing, and we will do a Covid check for all patient's and care partners when you arrive. Also we will check their temperature and your temperature. If the care partner waits in their car they need to stay in the parking lot the entire time and we will call them on their cell phone when the patient is ready for discharge so they can bring the car to the front of the building. Also all patient's will need to wear a mask into building.

## 2020-01-04 ENCOUNTER — Telehealth: Payer: Self-pay

## 2020-01-04 ENCOUNTER — Other Ambulatory Visit: Payer: Self-pay | Admitting: Gastroenterology

## 2020-01-04 LAB — SARS CORONAVIRUS 2 (TAT 6-24 HRS): SARS Coronavirus 2: NEGATIVE

## 2020-01-04 NOTE — Telephone Encounter (Signed)
Patient c/o bilateral swelling and pain in ankles. Said swelling was unchanged but pain was new. On further investigation, patient had not done exercises in a few days and had not yet tried ibuprofen or APAP. Patient wanted a pain RX. Discussed with Dr. Scot Dock and encouraged patient to elevate extremities, exercise and try APAP or ibuprofen for pain. Patient to call back if pain unrelieved or getting worse with interventions. Patient was agreeable to plan.

## 2020-01-05 ENCOUNTER — Encounter: Payer: Self-pay | Admitting: Gastroenterology

## 2020-01-05 ENCOUNTER — Other Ambulatory Visit: Payer: Self-pay

## 2020-01-05 ENCOUNTER — Ambulatory Visit (AMBULATORY_SURGERY_CENTER): Payer: Medicaid Other | Admitting: Gastroenterology

## 2020-01-05 VITALS — BP 110/68 | HR 72 | Temp 97.7°F | Resp 16 | Ht 61.0 in | Wt 174.0 lb

## 2020-01-05 DIAGNOSIS — D128 Benign neoplasm of rectum: Secondary | ICD-10-CM

## 2020-01-05 DIAGNOSIS — Z419 Encounter for procedure for purposes other than remedying health state, unspecified: Secondary | ICD-10-CM | POA: Diagnosis not present

## 2020-01-05 DIAGNOSIS — Z1211 Encounter for screening for malignant neoplasm of colon: Secondary | ICD-10-CM | POA: Diagnosis not present

## 2020-01-05 DIAGNOSIS — D129 Benign neoplasm of anus and anal canal: Secondary | ICD-10-CM

## 2020-01-05 DIAGNOSIS — K621 Rectal polyp: Secondary | ICD-10-CM

## 2020-01-05 MED ORDER — SODIUM CHLORIDE 0.9 % IV SOLN: 500.0000 mL | Freq: Once | INTRAVENOUS | Status: DC

## 2020-01-05 NOTE — Patient Instructions (Signed)
Read all of the handouts given to you by your recovery room nurse.  Thank-you for choosing [place] for your healthcare needs today.  YOU HAD AN ENDOSCOPIC PROCEDURE TODAY AT [facility]:   Refer to the procedure report that was given to you for any specific questions about what was found during the examination.  If the procedure report does not answer your questions, please call your gastroenterologist to clarify.  If you requested that your care partner not be given the details of your procedure findings, then the procedure report has been included in a sealed envelope for you to review at your convenience later.  YOU SHOULD EXPECT: Some feelings of bloating in the abdomen. Passage of more gas than usual.  Walking can help get rid of the air that was put into your GI tract during the procedure and reduce the bloating. If you had a lower endoscopy (such as a colonoscopy or flexible sigmoidoscopy) you may notice spotting of blood in your stool or on the toilet paper. If you underwent a bowel prep for your procedure, you may not have a normal bowel movement for a few days.  Please Note:  You might notice some irritation and congestion in your nose or some drainage.  This is from the oxygen used during your procedure.  There is no need for concern and it should clear up in a day or so.  SYMPTOMS TO REPORT IMMEDIATELY:   Following lower endoscopy (colonoscopy or flexible sigmoidoscopy):  Excessive amounts of blood in the stool  Significant tenderness or worsening of abdominal pains  Swelling of the abdomen that is new, acute  Fever of 100F or higher   For urgent or emergent issues, a gastroenterologist can be reached at any hour by calling [phone]. Do not use MyChart messaging for urgent concerns.    DIET:  We do recommend a small meal at first, but then you may proceed to your regular diet.  Drink plenty of fluids but you should avoid alcoholic beverages for 24 hours. Try to  eat more fiber, and drink more water.  ACTIVITY:  You should plan to take it easy for the rest of today and you should NOT DRIVE or use heavy machinery until tomorrow (because of the sedation medicines used during the test).    FOLLOW UP: Our staff will call the number listed on your records 48-72 hours following your procedure to check on you and address any questions or concerns that you may have regarding the information given to you following your procedure. If we do not reach you, we will leave a message.  We will attempt to reach you two times.  During this call, we will ask if you have developed any symptoms of COVID 19. If you develop any symptoms (ie: fever, flu-like symptoms, shortness of breath, cough etc.) before then, please call [phone].  If you test positive for Covid 19 in the 2 weeks post procedure, please call and report this information to [place].    If any biopsies were taken you will be contacted by phone or by letter within the next 1-3 weeks.  Please call us at [phone] if you have not heard about the biopsies in 3 weeks.    SIGNATURES/CONFIDENTIALITY: You and/or your care partner have signed paperwork which will be entered into your electronic medical record.  These signatures attest to the fact that that the information above on your After Visit Summary has been reviewed and is understood.  Full responsibility of the confidentiality of this discharge information lies with you and/or your care-partner.

## 2020-01-05 NOTE — Progress Notes (Signed)
Pt. Reports no change in her medical or surgical history since her pre-visit 12/28/2019. 

## 2020-01-05 NOTE — Op Note (Signed)
[facility] Patient Name: Audrey Rodriguez Procedure Date: 01/05/2020 2:55 PM MRN: [id number] Endoscopist: Mauri Pole , MD Age: 49 Referring MD:  Date of Birth: Apr 05, 1971 Gender: Female Account #: [id number] Procedure:                Colonoscopy Indications:              Screening for colorectal malignant neoplasm Medicines:                Monitored Anesthesia Care Procedure:                Pre-Anesthesia Assessment:                           - Prior to the procedure, a History and Physical                            was performed, and patient medications and                            allergies were reviewed. The patient's tolerance of                            previous anesthesia was also reviewed. The risks                            and benefits of the procedure and the sedation                            options and risks were discussed with the patient.                            All questions were answered, and informed consent                            was obtained. Prior Anticoagulants: The patient has                            taken no previous anticoagulant or antiplatelet                            agents. ASA Grade Assessment: II - A patient with                            mild systemic disease. After reviewing the risks                            and benefits, the patient was deemed in                            satisfactory condition to undergo the procedure.                           After obtaining informed consent, the colonoscope  was passed under direct vision. Throughout the                            procedure, the patient's blood pressure, pulse, and                            oxygen saturations were monitored continuously. The                            Colonoscope was introduced through the anus and                            advanced to the the cecum, identified by                            appendiceal orifice and  ileocecal valve. The                            colonoscopy was performed without difficulty. The                            patient tolerated the procedure well. The quality                            of the bowel preparation was excellent. The                            ileocecal valve, appendiceal orifice, and rectum                            were photographed. Scope In: 3:01:17 PM Scope Out: 3:20:08 PM Scope Withdrawal Time: 0 hours 13 minutes 11 seconds  Total Procedure Duration: 0 hours 18 minutes 51 seconds  Findings:                 The perianal and digital rectal examinations were                            normal.                           A 5 mm polyp was found in the rectum. The polyp was                            sessile. The polyp was removed with a cold snare.                            Resection and retrieval were complete.                           Non-bleeding internal hemorrhoids were found during                            retroflexion. The hemorrhoids were small.  The exam was otherwise normal throughout the                            examined colon. Complications:            No immediate complications. Estimated Blood Loss:     Estimated blood loss was minimal. Impression:               - One 5 mm polyp in the rectum, removed with a cold                            snare. Resected and retrieved.                           - Non-bleeding internal hemorrhoids. Recommendation:           - Patient has a contact number available for                            emergencies. The signs and symptoms of potential                            delayed complications were discussed with the                            patient. Return to normal activities tomorrow.                            Written discharge instructions were provided to the                            patient.                           - Resume previous diet.                           -  Continue present medications.                           - Await pathology results.                           - Repeat colonoscopy in 5-10 years for surveillance                            based on pathology results. Mauri Pole, MD 01/05/2020 3:24:21 PM This report has been signed electronically.

## 2020-01-05 NOTE — Progress Notes (Signed)
Report to PACU, RN, vss, BBS= Clear.  

## 2020-01-05 NOTE — Progress Notes (Signed)
Called to room to assist during endoscopic procedure.  Patient ID and intended procedure confirmed with present staff. Received instructions for my participation in the procedure from the performing physician.  

## 2020-01-09 ENCOUNTER — Telehealth: Payer: Self-pay | Admitting: *Deleted

## 2020-01-09 NOTE — Telephone Encounter (Signed)
°  Follow up Call-  Call back number 01/05/2020  Post procedure Call Back phone  # [phone]  Permission to leave phone message Yes  Some recent data might be hidden     Patient questions:  Do you have a fever, pain , or abdominal swelling? No. Pain Score  0 *  Have you tolerated food without any problems? Yes.    Have you been able to return to your normal activities? Yes.    Do you have any questions about your discharge instructions: Diet   No. Medications  No. Follow up visit  No.  Do you have questions or concerns about your Care? No.  Actions: * If pain score is 4 or above: No action needed, pain <4  1. Have you developed a fever since your procedure? NO  2.   Have you had an respiratory symptoms (SOB or cough) since your procedure? NO  3.   Have you tested positive for COVID 19 since your procedure NO  4.   Have you had any family members/close contacts diagnosed with the COVID 19 since your procedure?  NO   If yes to any of these questions please route to Joylene John, RN and Joella Prince, RN

## 2020-01-23 ENCOUNTER — Encounter: Payer: Self-pay | Admitting: Gastroenterology

## 2020-01-24 ENCOUNTER — Ambulatory Visit (HOSPITAL_COMMUNITY)
Admission: EM | Admit: 2020-01-24 | Discharge: 2020-01-24 | Disposition: A | Discharge status: Home / Self Care | Payer: Medicaid Other | Attending: Family Medicine | Admitting: Family Medicine

## 2020-01-24 ENCOUNTER — Other Ambulatory Visit: Payer: Self-pay

## 2020-01-24 DIAGNOSIS — R350 Frequency of micturition: Secondary | ICD-10-CM

## 2020-01-24 DIAGNOSIS — M545 Low back pain, unspecified: Secondary | ICD-10-CM | POA: Diagnosis not present

## 2020-01-24 DIAGNOSIS — R109 Unspecified abdominal pain: Secondary | ICD-10-CM

## 2020-01-24 LAB — BASIC METABOLIC PANEL
Anion gap: 11 (ref 5–15)
BUN: 9 mg/dL (ref 6–20)
CO2: 25 mmol/L (ref 22–32)
Calcium: 9.9 mg/dL (ref 8.9–10.3)
Chloride: 100 mmol/L (ref 98–111)
Creatinine, Ser: 0.76 mg/dL (ref 0.44–1.00)
GFR, Estimated: >60 mL/min (ref >60)
Glucose, Bld: 150 mg/dL — ABNORMAL HIGH (ref 70–99)
Potassium: 4.2 mmol/L (ref 3.5–5.1)
Sodium: 136 mmol/L (ref 135–145)

## 2020-01-24 LAB — POCT URINALYSIS DIPSTICK, ED / UC
Bilirubin Urine: NEGATIVE
Glucose, UA: 500 mg/dL — AB
Hgb urine dipstick: NEGATIVE
Ketones, ur: NEGATIVE mg/dL
Leukocytes,Ua: NEGATIVE
Nitrite: NEGATIVE
Protein, ur: NEGATIVE mg/dL
Specific Gravity, Urine: 1.02 (ref 1.005–1.030)
Urobilinogen, UA: 0.2 mg/dL (ref 0.0–1.0)
pH: 5.5 (ref 5.0–8.0)

## 2020-01-24 LAB — CBG MONITORING, ED: Glucose-Capillary: 162 mg/dL — ABNORMAL HIGH (ref 70–99)

## 2020-01-24 MED ORDER — TIZANIDINE HCL 4 MG PO TABS: 4.0000 mg | ORAL_TABLET | Freq: Four times a day (QID) | ORAL | 0 refills | Status: DC | PRN

## 2020-01-24 MED ORDER — NAPROXEN 500 MG PO TABS: 500.0000 mg | ORAL_TABLET | Freq: Two times a day (BID) | ORAL | 0 refills | Status: DC

## 2020-01-24 NOTE — ED Provider Notes (Signed)
[place]    CSN: [id number] Arrival date & time: 01/24/20  1552      History   Chief Complaint Chief Complaint  Patient presents with  . Flank Pain  . Urinary Frequency    HPI Audrey Rodriguez is a 49 y.o. female history of GERD, presenting today for evaluation of right lower back/flank pain.  Patient reports that she has had persistent pain in her lower back for approximately 1 week.  Pain is intermittent, comes and goes throughout the day.  Notices pain more when she is sitting for extended periods of time or with changing position/movement.  She also has noted some urinary frequency and some slight lower abdominal discomfort.  Has had some slight nausea.  Denies dysuria, incomplete voiding.  Using ibuprofen without significant relief of symptoms.  Denies history of kidney stones.  Denies history of diabetes.  She is concerned about her kidneys.  Denies injury or trauma to the area.  Denies saddle anesthesia/numbness or tingling.  Denies loss of control of bowel/bladder.  HPI  Past Medical History:  Diagnosis Date  . Anemia   . Ankle swelling    for 5 years  . Anxiety   . Arthritis    swelling in left ankle at times   . GERD (gastroesophageal reflux disease)    no meds  . Heart murmur   . Hemorrhoids    since child birth, 25 years ago  . Hypercholesteremia     Patient Active Problem List   Diagnosis Date Noted  . Ganglion cyst of right foot 11/26/2014  . Tendonitis, Achilles, left 11/26/2014  . Left arm pain 11/22/2013  . Iron deficiency anemia 11/22/2013  . Heart murmur previously undiagnosed 11/22/2013  . Fibroids 02/24/2012    Past Surgical History:  Procedure Laterality Date  . CESAREAN SECTION     x 5    OB History    Gravida  5   Para  5   Term      Preterm      AB      Living  5     SAB      TAB      Ectopic      Multiple      Live Births               Home Medications    Prior to Admission medications     Medication Sig Start Date End Date Taking? Authorizing Provider  aspirin 325 MG tablet Take 325 mg by mouth daily.   Yes [provider]  simvastatin (ZOCOR) 20 MG tablet Take 20 mg by mouth daily.   Yes [provider]  naproxen (NAPROSYN) 500 MG tablet Take 1 tablet (500 mg total) by mouth 2 (two) times daily. 01/24/20   Billee Balcerzak C, PA-C  tiZANidine (ZANAFLEX) 4 MG tablet Take 1 tablet (4 mg total) by mouth every 6 (six) hours as needed for muscle spasms. 01/24/20   Desirey Keahey, Elesa Hacker, PA-C    Family History Family History  Problem Relation Age of Onset  . Heart disease Mother   . Diabetes Father   . Kidney disease Father   . Colon cancer Neg Hx   . Colon polyps Neg Hx   . Stomach cancer Neg Hx   . Esophageal cancer Neg Hx   . Rectal cancer Neg Hx     Social History Social History   Tobacco Use  . 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 Smokeless tobacco: Never Used  Vaping Use  . Vaping Use: Never used  Substance Use Topics  . Alcohol use: No  . Drug use: No     Allergies   Patient has no known allergies.   Review of Systems Review of Systems  Constitutional: Negative for fever.  Respiratory: Negative for shortness of breath.   Cardiovascular: Negative for chest pain.  Gastrointestinal: Negative for abdominal pain, diarrhea, nausea and vomiting.  Genitourinary: Positive for flank pain and frequency. Negative for dysuria, genital sores, hematuria, menstrual problem, vaginal bleeding, vaginal discharge and vaginal pain.  Musculoskeletal: Positive for back pain.  Skin: Negative for rash.  Neurological: Negative for dizziness, light-headedness and headaches.     Physical Exam Triage Vital Signs ED Triage Vitals  Enc Vitals Group     BP      Pulse      Resp      Temp      Temp src      SpO2      Weight      Height      Head Circumference      Peak Flow      Pain Score      Pain  Loc      Pain Edu?      Excl. in [place]?    No data found.  Updated Vital Signs BP (!) 146/94 (BP Location: Left Arm)   Pulse 76   Temp 98.6 F (37 C) (Oral)   Resp 17   LMP  (LMP Unknown)   SpO2 100%   Visual Acuity Right Eye Distance:   Left Eye Distance:   Bilateral Distance:    Right Eye Near:   Left Eye Near:    Bilateral Near:     Physical Exam Vitals and nursing note reviewed.  Constitutional:      Appearance: She is well-developed.     Comments: No acute distress  HENT:     Head: Normocephalic and atraumatic.     Nose: Nose normal.  Eyes:     Conjunctiva/sclera: Conjunctivae normal.  Cardiovascular:     Rate and Rhythm: Normal rate.  Pulmonary:     Effort: Pulmonary effort is normal. No respiratory distress.     Comments: Breathing comfortably at rest, CTABL, no wheezing, rales or other adventitious sounds auscultated Abdominal:     General: There is no distension.     Comments: Soft, nondistended, nontender to light and deep palpation of abdomen  Musculoskeletal:        General: Normal range of motion.     Cervical back: Neck supple.     Comments: Nontender to palpation along lumbar spine midline or bilateral lumbar areas extending to flank  Skin:    General: Skin is warm and dry.  Neurological:     Mental Status: She is alert and oriented to person, place, and time.      UC Treatments / Results  Labs (all labs ordered are listed, but only abnormal results are displayed) Labs Reviewed  BASIC METABOLIC PANEL - Abnormal; Notable for the following components:      Result Value   Glucose, Bld 150 (*)    All other components within normal limits  POCT URINALYSIS DIPSTICK, ED / UC - Abnormal; Notable for the following components:   Glucose, UA 500 (*)    All other components within normal limits  CBG MONITORING, ED - Abnormal; Notable for  the following components:   Glucose-Capillary 162 (*)    All other components within normal limits     EKG   Radiology No results found.  Procedures Procedures (including critical care time)  Medications Ordered in UC Medications - No data to display  Initial Impression / Assessment and Plan / UC Course  I have reviewed the triage vital signs and the nursing notes.  Pertinent labs & imaging results that were available during my care of the patient were reviewed by me and considered in my medical decision making (see chart for details).     UA with negative leuks and nitrites, negative hemoglobin, not consistent with UTI or stone.  Does have 500 glucose, checking point-of-care blood sugar which is 162, discussed likely urinary frequency may be from underlying diabetes.  Discussed lifestyle modifications initially and following up with primary care.  Checking BMP to ensure kidney function stable, recommending treatment with anti-inflammatories and muscle relaxers as suspect more likely MSK etiology.  Do not suspect acute bony abnormality.  Providing Naprosyn as alternative NSAID and tizanidine to supplement at home/bedtime.  Discussed strict return precautions. Patient verbalized understanding and is agreeable with plan.  Final Clinical Impressions(s) / UC Diagnoses   Final diagnoses:  Acute right-sided low back pain without sciatica     Discharge Instructions     Urine with glucose, blood sugar 162- Follow up with primary care Drink plenty for fluids Naprosyn twice daily for back pain I supplement with tizanidine as needed at home at bedtime Alternate ice and heat to back Gentle stretching Follow-up if not improving or worsening    ED Prescriptions    Medication Sig Dispense Auth. Provider   naproxen (NAPROSYN) 500 MG tablet Take 1 tablet (500 mg total) by mouth 2 (two) times daily. 30 tablet Kenneith Stief C, PA-C   tiZANidine (ZANAFLEX) 4 MG tablet Take 1 tablet (4 mg total) by mouth every 6 (six) hours as needed for muscle spasms. 30 tablet Srihan Brutus, [place] C, PA-C      PDMP not reviewed this encounter.   Joneen Caraway Jal C, PA-C 01/25/20 0945

## 2020-01-24 NOTE — Discharge Instructions (Signed)
Urine with glucose, blood sugar 162- Follow up with primary care Drink plenty for fluids Naprosyn twice daily for back pain I supplement with tizanidine as needed at home at bedtime Alternate ice and heat to back Gentle stretching Follow-up if not improving or worsening

## 2020-01-24 NOTE — ED Triage Notes (Signed)
Pt present with lower right back side pain x 1 week. She states it hurts when she sits for too long and when she stands up. Pt also presents with frequency and lower abdominal pain. She also states she feels nauseated. She has taken 800 mg of ibuprofen.

## 2020-01-29 DIAGNOSIS — K219 Gastro-esophageal reflux disease without esophagitis: Secondary | ICD-10-CM | POA: Diagnosis not present

## 2020-01-29 DIAGNOSIS — Z23 Encounter for immunization: Secondary | ICD-10-CM | POA: Diagnosis not present

## 2020-01-29 DIAGNOSIS — E1165 Type 2 diabetes mellitus with hyperglycemia: Secondary | ICD-10-CM | POA: Diagnosis not present

## 2020-01-29 DIAGNOSIS — J302 Other seasonal allergic rhinitis: Secondary | ICD-10-CM | POA: Diagnosis not present

## 2020-01-29 DIAGNOSIS — R030 Elevated blood-pressure reading, without diagnosis of hypertension: Secondary | ICD-10-CM | POA: Diagnosis not present

## 2020-01-31 DIAGNOSIS — E538 Deficiency of other specified B group vitamins: Secondary | ICD-10-CM | POA: Diagnosis not present

## 2020-01-31 DIAGNOSIS — R030 Elevated blood-pressure reading, without diagnosis of hypertension: Secondary | ICD-10-CM | POA: Diagnosis not present

## 2020-01-31 DIAGNOSIS — E1165 Type 2 diabetes mellitus with hyperglycemia: Secondary | ICD-10-CM | POA: Diagnosis not present

## 2020-02-05 DIAGNOSIS — Z419 Encounter for procedure for purposes other than remedying health state, unspecified: Secondary | ICD-10-CM | POA: Diagnosis not present

## 2020-03-06 DIAGNOSIS — Z419 Encounter for procedure for purposes other than remedying health state, unspecified: Secondary | ICD-10-CM | POA: Diagnosis not present

## 2020-03-07 DIAGNOSIS — E7849 Other hyperlipidemia: Secondary | ICD-10-CM | POA: Diagnosis not present

## 2020-03-07 DIAGNOSIS — J302 Other seasonal allergic rhinitis: Secondary | ICD-10-CM | POA: Diagnosis not present

## 2020-03-07 DIAGNOSIS — E669 Obesity, unspecified: Secondary | ICD-10-CM | POA: Diagnosis not present

## 2020-03-07 DIAGNOSIS — E1165 Type 2 diabetes mellitus with hyperglycemia: Secondary | ICD-10-CM | POA: Diagnosis not present

## 2020-03-26 ENCOUNTER — Emergency Department (HOSPITAL_COMMUNITY)
Admission: EM | Admit: 2020-03-26 | Discharge: 2020-03-26 | Disposition: A | Discharge status: Home / Self Care | Payer: Medicaid Other | Attending: Emergency Medicine | Admitting: Emergency Medicine

## 2020-03-26 ENCOUNTER — Other Ambulatory Visit: Payer: Self-pay

## 2020-03-26 ENCOUNTER — Encounter (HOSPITAL_COMMUNITY): Payer: Self-pay

## 2020-03-26 DIAGNOSIS — Z87891 Personal history of nicotine dependence: Secondary | ICD-10-CM | POA: Diagnosis not present

## 2020-03-26 DIAGNOSIS — L02212 Cutaneous abscess of back [any part, except buttock]: Secondary | ICD-10-CM | POA: Insufficient documentation

## 2020-03-26 DIAGNOSIS — L0291 Cutaneous abscess, unspecified: Secondary | ICD-10-CM

## 2020-03-26 DIAGNOSIS — Z7982 Long term (current) use of aspirin: Secondary | ICD-10-CM | POA: Diagnosis not present

## 2020-03-26 MED ORDER — LIDOCAINE-EPINEPHRINE (PF) 2 %-1:200000 IJ SOLN
10.0000 mL | Freq: Once | INTRAMUSCULAR | Status: AC
  Administered 2020-03-26: 10 mL
  Filled 2020-03-26: qty 20

## 2020-03-26 NOTE — ED Triage Notes (Signed)
Pt reports noticing a bump on upper back about a month ago and since then it was become larger. Pt reports the area is beginning to drain pus and has an odor to it.

## 2020-03-26 NOTE — ED Notes (Signed)
Discharge paperwork reviewed with pt.  Pt with no questions or concerns at time of discharge. Ambulatory with family to ED exit.

## 2020-03-26 NOTE — Discharge Instructions (Addendum)
Wash twice a day with soap and water. Use warm compresses 3 times a day for the next week. Use Tylenol or ibuprofen as needed for pain. Follow-up with your primary care doctor as needed for recheck. Return to the emergency room if you develop fevers, severe worsening pain, increasing drainage from the area.  If you continue to have issues with recurrent boils, you may follow-up with a dermatologist.  You can be referred by your primary care doctor or follow up with the person below.

## 2020-03-26 NOTE — ED Provider Notes (Signed)
[facility] DEPT Provider Note   CSN: [id number] Arrival date & time: 03/26/20  1149     History Chief Complaint  Patient presents with  . Abscess    Audrey Rodriguez is a 49 y.o. female presenting for evaluation of lesion on her back.  Patient states of the past month, she has had a lesion on her right upper back.  Over the past week, it has grown to become more painful.  Is starting to drain foul-smelling discharge.  She denies fevers, chills, nausea, vomiting, weakness, or confusion.  She does not have a history of diabetes.  She is not immunocompromise.  She has never needed a boil drained before.  HPI     Past Medical History:  Diagnosis Date  . Anemia   . Ankle swelling    for 5 years  . Anxiety   . Arthritis    swelling in left ankle at times   . GERD (gastroesophageal reflux disease)    no meds  . Heart murmur   . Hemorrhoids    since child birth, 25 years ago  . Hypercholesteremia     Patient Active Problem List   Diagnosis Date Noted  . Ganglion cyst of right foot 11/26/2014  . Tendonitis, Achilles, left 11/26/2014  . Left arm pain 11/22/2013  . Iron deficiency anemia 11/22/2013  . Heart murmur previously undiagnosed 11/22/2013  . Fibroids 02/24/2012    Past Surgical History:  Procedure Laterality Date  . CESAREAN SECTION     x 5     OB History    Gravida  5   Para  5   Term      Preterm      AB      Living  5     SAB      IAB      Ectopic      Multiple      Live Births              Family History  Problem Relation Age of Onset  . Heart disease Mother   . Diabetes Father   . Kidney disease Father   . Colon cancer Neg Hx   . Colon polyps Neg Hx   . Stomach cancer Neg Hx   . Esophageal cancer Neg Hx   . Rectal cancer Neg Hx     Social History   Tobacco Use  . Smoking status: Former Smoker    Quit date: 12/28/1997    Years since quitting: 22.2  . Smokeless tobacco: Never Used  Vaping  Use  . Vaping Use: Never used  Substance Use Topics  . Alcohol use: No  . Drug use: No    Home Medications Prior to Admission medications   Medication Sig Start Date End Date Taking? Authorizing Provider  aspirin 325 MG tablet Take 325 mg by mouth daily.    [provider]  naproxen (NAPROSYN) 500 MG tablet Take 1 tablet (500 mg total) by mouth 2 (two) times daily. 01/24/20   Wieters, Hallie C, PA-C  simvastatin (ZOCOR) 20 MG tablet Take 20 mg by mouth daily.    [provider]  tiZANidine (ZANAFLEX) 4 MG tablet Take 1 tablet (4 mg total) by mouth every 6 (six) hours as needed for muscle spasms. 01/24/20   Wieters, Hallie C, PA-C    Allergies    Patient has no known allergies.  Review of Systems   Review of Systems  Skin:  Lesion of R upper back  Hematological: Does not bruise/bleed easily.    Physical Exam Updated Vital Signs BP 138/87 (BP Location: Right Arm)   Pulse 72   Temp 98 F (36.7 C) (Oral)   Resp 16   Ht 5\' 1"  (1.549 m)   Wt 73.9 kg   SpO2 100%   BMI 30.80 kg/m   Physical Exam Vitals and nursing note reviewed.  Constitutional:      General: She is not in acute distress.    Appearance: She is well-developed and well-nourished.  HENT:     Head: Normocephalic and atraumatic.  Eyes:     Extraocular Movements: EOM normal.  Pulmonary:     Effort: Pulmonary effort is normal.       Comments: Small, 1 cm, lesion of fluctuance.  No active drainage.  No surrounding skin erythema, induration, or signs of cellulitis. Abdominal:     General: There is no distension.  Musculoskeletal:        General: Normal range of motion.     Cervical back: Normal range of motion.  Skin:    General: Skin is warm.     Capillary Refill: Capillary refill takes less than 2 seconds.     Findings: No rash.  Neurological:     Mental Status: She is alert and oriented to person, place, and time.  Psychiatric:        Mood and Affect: Mood and affect normal.      ED Results / Procedures / Treatments   Labs (all labs ordered are listed, but only abnormal results are displayed) Labs Reviewed - No data to display  EKG None  Radiology No results found.  Procedures .Marland KitchenIncision and Drainage  Date/Time: 03/26/2020 2:31 PM Performed by: Franchot Heidelberg, PA-C Authorized by: Franchot Heidelberg, PA-C   Consent:    Consent obtained:  Verbal   Consent given by:  Patient   Risks, benefits, and alternatives were discussed: yes     Risks discussed:  Bleeding, damage to other organs, incomplete drainage, infection and pain Universal protocol:    Procedure explained and questions answered to patient or proxy's satisfaction: yes   Location:    Type:  Abscess   Location:  Trunk   Trunk location:  Back Pre-procedure details:    Skin preparation:  Antiseptic wash Anesthesia:    Anesthesia method:  Local infiltration   Local anesthetic:  Lidocaine 2% WITH epi Procedure type:    Complexity:  Simple Procedure details:    Incision types:  Single straight   Incision depth:  Dermal   Wound management:  Probed and deloculated and irrigated with saline   Drainage:  Purulent   Drainage amount:  Moderate   Wound treatment:  Wound left open   Packing materials:  None Post-procedure details:    Procedure completion:  Tolerated well, no immediate complications   (including critical care time)  Medications Ordered in ED Medications  lidocaine-EPINEPHrine (XYLOCAINE W/EPI) 2 %-1:200000 (PF) injection 10 mL (10 mLs Infiltration Given by Other 03/26/20 1401)    ED Course  I have reviewed the triage vital signs and the nursing notes.  Pertinent labs & imaging results that were available during my care of the patient were reviewed by me and considered in my medical decision making (see chart for details).    MDM Rules/Calculators/A&P                          Patient presenting for  evaluation of painful lesion on the right upper back.  On exam,  patient has an area of fluctuance without active drainage.  Vital signs are reassuring, no fever, she exam not consistent with sepsis.  I&D performed as described above.  She has no overlying cellulitis.  Discussed that this time, I do not believe she needs systemic antibiotics.  Discussed aftercare for I&D and follow-up with PCP as needed.  At this time, patient appears safe for discharge.  Return precautions given.  Patient states she understands and agrees to plan.   Final Clinical Impression(s) / ED Diagnoses Final diagnoses:  Abscess    Rx / DC Orders ED Discharge Orders    None       Alveria Apley, PA-C 03/26/20 1434    Tegeler, Canary Brim, MD 03/26/20 1731

## 2020-04-06 DIAGNOSIS — Z419 Encounter for procedure for purposes other than remedying health state, unspecified: Secondary | ICD-10-CM | POA: Diagnosis not present

## 2020-05-07 DIAGNOSIS — Z419 Encounter for procedure for purposes other than remedying health state, unspecified: Secondary | ICD-10-CM | POA: Diagnosis not present

## 2020-06-04 DIAGNOSIS — Z419 Encounter for procedure for purposes other than remedying health state, unspecified: Secondary | ICD-10-CM | POA: Diagnosis not present

## 2020-06-06 ENCOUNTER — Other Ambulatory Visit: Payer: Self-pay | Admitting: Internal Medicine

## 2020-06-06 DIAGNOSIS — Z1231 Encounter for screening mammogram for malignant neoplasm of breast: Secondary | ICD-10-CM

## 2020-06-07 ENCOUNTER — Emergency Department (HOSPITAL_COMMUNITY)
Admission: EM | Admit: 2020-06-07 | Discharge: 2020-06-07 | Disposition: A | Discharge status: Home / Self Care | Payer: Medicaid Other | Attending: Emergency Medicine | Admitting: Emergency Medicine

## 2020-06-07 ENCOUNTER — Emergency Department (HOSPITAL_COMMUNITY): Payer: Medicaid Other

## 2020-06-07 ENCOUNTER — Encounter (HOSPITAL_COMMUNITY): Payer: Self-pay

## 2020-06-07 ENCOUNTER — Other Ambulatory Visit: Payer: Self-pay

## 2020-06-07 DIAGNOSIS — Z7982 Long term (current) use of aspirin: Secondary | ICD-10-CM | POA: Insufficient documentation

## 2020-06-07 DIAGNOSIS — R0789 Other chest pain: Secondary | ICD-10-CM | POA: Diagnosis not present

## 2020-06-07 DIAGNOSIS — R079 Chest pain, unspecified: Secondary | ICD-10-CM | POA: Diagnosis not present

## 2020-06-07 DIAGNOSIS — Z87891 Personal history of nicotine dependence: Secondary | ICD-10-CM | POA: Insufficient documentation

## 2020-06-07 LAB — BASIC METABOLIC PANEL
Anion gap: 7 (ref 5–15)
BUN: 12 mg/dL (ref 6–20)
CO2: 26 mmol/L (ref 22–32)
Calcium: 9.2 mg/dL (ref 8.9–10.3)
Chloride: 105 mmol/L (ref 98–111)
Creatinine, Ser: 0.65 mg/dL (ref 0.44–1.00)
GFR, Estimated: >60 mL/min (ref >60)
Glucose, Bld: 147 mg/dL — ABNORMAL HIGH (ref 70–99)
Potassium: 3.8 mmol/L (ref 3.5–5.1)
Sodium: 138 mmol/L (ref 135–145)

## 2020-06-07 LAB — URINALYSIS, ROUTINE W REFLEX MICROSCOPIC
Bacteria, UA: NONE SEEN
Bilirubin Urine: NEGATIVE
Glucose, UA: NEGATIVE mg/dL
Ketones, ur: NEGATIVE mg/dL
Leukocytes,Ua: NEGATIVE
Nitrite: NEGATIVE
Protein, ur: NEGATIVE mg/dL
Specific Gravity, Urine: 1.013 (ref 1.005–1.030)
pH: 8 (ref 5.0–8.0)

## 2020-06-07 LAB — CBC
HCT: 35.2 % — ABNORMAL LOW (ref 36.0–46.0)
Hemoglobin: 11.3 g/dL — ABNORMAL LOW (ref 12.0–15.0)
MCH: 27.3 pg (ref 26.0–34.0)
MCHC: 32.1 g/dL (ref 30.0–36.0)
MCV: 85 fL (ref 80.0–100.0)
Platelets: 312 10*3/uL (ref 150–400)
RBC: 4.14 MIL/uL (ref 3.87–5.11)
RDW: 13.2 % (ref 11.5–15.5)
WBC: 6 10*3/uL (ref 4.0–10.5)
nRBC: 0 % (ref 0.0–0.2)

## 2020-06-07 LAB — I-STAT BETA HCG BLOOD, ED (MC, WL, AP ONLY): I-stat hCG, quantitative: <5 m[IU]/mL (ref <5)

## 2020-06-07 LAB — TROPONIN I (HIGH SENSITIVITY)
Troponin I (High Sensitivity): 2 ng/L (ref <18)
Troponin I (High Sensitivity): <2 ng/L (ref <18)

## 2020-06-07 NOTE — ED Notes (Signed)
Discharge no concerns at this time

## 2020-06-07 NOTE — Discharge Instructions (Addendum)
You have been seen and discharged from the emergency department.  Follow-up with your primary provider for reevaluation. Take home medications as prescribed. If you have any worsening symptoms, severe chest pain, difficulty breathing or further concerns for health please return to an emergency department for further evaluation.

## 2020-06-07 NOTE — ED Notes (Signed)
Patient ambulatory to restroom with no assitance

## 2020-06-07 NOTE — ED Triage Notes (Signed)
Pt presents with c/o chest pain in the center of her chest for a couple of days.

## 2020-06-07 NOTE — ED Provider Notes (Addendum)
[facility] DEPT Provider Note   CSN: [id number] Arrival date & time: 06/07/20  1541     History Chief Complaint  Patient presents with  . Chest Pain    Audrey Rodriguez is a 50 y.o. female.  HPI  50 year old female past medical history of HLD, GERD presents the emergency department evaluation for left-sided chest pain.  Patient states that this has been going on for about 2 days.  She describes the episodes as brief, sharp pain in the left chest that then self resolves.  The episodes are sporadic, not exertional.  Not associated with any radiation, shortness of breath or lightheadedness. No jaw pain. She has no known cardiac disease.  Denies any ongoing shortness of breath, cough, fever, swelling of her lower extremities.  She does have history of cardiac disease in her mom.  Does not smoke cigarettes.  Past Medical History:  Diagnosis Date  . Anemia   . Ankle swelling    for 5 years  . Anxiety   . Arthritis    swelling in left ankle at times   . GERD (gastroesophageal reflux disease)    no meds  . Heart murmur   . Hemorrhoids    since child birth, 25 years ago  . Hypercholesteremia     Patient Active Problem List   Diagnosis Date Noted  . Ganglion cyst of right foot 11/26/2014  . Tendonitis, Achilles, left 11/26/2014  . Left arm pain 11/22/2013  . Iron deficiency anemia 11/22/2013  . Heart murmur previously undiagnosed 11/22/2013  . Fibroids 02/24/2012    Past Surgical History:  Procedure Laterality Date  . CESAREAN SECTION     x 5     OB History    Gravida  5   Para  5   Term      Preterm      AB      Living  5     SAB      IAB      Ectopic      Multiple      Live Births              Family History  Problem Relation Age of Onset  . Heart disease Mother   . Diabetes Father   . Kidney disease Father   . Colon cancer Neg Hx   . Colon polyps Neg Hx   . Stomach cancer Neg Hx   . Esophageal cancer Neg Hx    . Rectal cancer Neg Hx     Social History   Tobacco Use  . Smoking status: Former Smoker    Quit date: 12/28/1997    Years since quitting: 22.4  . Smokeless tobacco: Never Used  Vaping Use  . Vaping Use: Never used  Substance Use Topics  . Alcohol use: No  . Drug use: No    Home Medications Prior to Admission medications   Medication Sig Start Date End Date Taking? Authorizing Provider  aspirin 325 MG tablet Take 325 mg by mouth daily.    [provider]  naproxen (NAPROSYN) 500 MG tablet Take 1 tablet (500 mg total) by mouth 2 (two) times daily. 01/24/20   Wieters, Hallie C, PA-C  simvastatin (ZOCOR) 20 MG tablet Take 20 mg by mouth daily.    [provider]  tiZANidine (ZANAFLEX) 4 MG tablet Take 1 tablet (4 mg total) by mouth every 6 (six) hours as needed for muscle spasms. 01/24/20   Wieters, Elesa Hacker, PA-C  Allergies    Patient has no known allergies.  Review of Systems   Review of Systems  Constitutional: Negative for chills and fever.  HENT: Negative for congestion.   Eyes: Negative for visual disturbance.  Respiratory: Negative for shortness of breath.   Cardiovascular: Positive for chest pain.  Gastrointestinal: Negative for abdominal pain, diarrhea and vomiting.  Genitourinary: Negative for dysuria.  Skin: Negative for rash.  Neurological: Negative for headaches.    Physical Exam Updated Vital Signs BP (!) 143/79   Pulse 75   Temp 98.6 F (37 C) (Oral)   Resp 20   Ht 5\' 1"  (1.549 m)   Wt 73 kg   SpO2 100%   BMI 30.41 kg/m   Physical Exam Vitals and nursing note reviewed.  Constitutional:      Appearance: Normal appearance.  HENT:     Head: Normocephalic.     Mouth/Throat:     Mouth: Mucous membranes are moist.  Cardiovascular:     Rate and Rhythm: Normal rate.  Pulmonary:     Effort: Pulmonary effort is normal. No respiratory distress.     Breath sounds: No wheezing or rales.  Abdominal:     Palpations: Abdomen is  soft.     Tenderness: There is no abdominal tenderness.  Musculoskeletal:     Right lower leg: No edema.     Left lower leg: No edema.  Skin:    General: Skin is warm.  Neurological:     Mental Status: She is alert and oriented to person, place, and time. Mental status is at baseline.  Psychiatric:        Mood and Affect: Mood normal.     ED Results / Procedures / Treatments   Labs (all labs ordered are listed, but only abnormal results are displayed) Labs Reviewed  BASIC METABOLIC PANEL - Abnormal; Notable for the following components:      Result Value   Glucose, Bld 147 (*)    All other components within normal limits  CBC - Abnormal; Notable for the following components:   Hemoglobin 11.3 (*)    HCT 35.2 (*)    All other components within normal limits  URINALYSIS, ROUTINE W REFLEX MICROSCOPIC - Abnormal; Notable for the following components:   Hgb urine dipstick MODERATE (*)    All other components within normal limits  I-STAT BETA HCG BLOOD, ED ([facility], [facility], [facility] ONLY)  TROPONIN I (HIGH SENSITIVITY)  TROPONIN I (HIGH SENSITIVITY)    EKG EKG Interpretation  Date/Time:  Friday June 07 2020 15:46:43 EST Ventricular Rate:  76 PR Interval:    QRS Duration: 84 QT Interval:  371 QTC Calculation: 418 R Axis:   25 Text Interpretation: Sinus rhythm Low voltage, precordial leads Baseline wander in lead(s) V6 12 Lead; Mason-Likar NSR, no acute changes from previous Confirmed by Lavenia Atlas [phone]) on 06/07/2020 3:58:56 PM   Radiology DG Chest 2 View  Result Date: 06/07/2020 CLINICAL DATA:  Chest pain. EXAM: CHEST - 2 VIEW COMPARISON:  April 23, 2017. FINDINGS: The heart size and mediastinal contours are within normal limits. Both lungs are clear. No pneumothorax or pleural effusion is noted. The visualized skeletal structures are unremarkable. IMPRESSION: No active cardiopulmonary disease. Electronically Signed   By: Marijo Conception M.D.   On: 06/07/2020 16:42     Procedures Procedures   Medications Ordered in ED Medications - No data to display  ED Course  I have reviewed the triage vital signs and the nursing notes.  Pertinent labs & imaging results that were available during my care of the patient were reviewed by me and considered in my medical decision making (see chart for details).    MDM Rules/Calculators/A&P HEAR Score: 67                        50 year old female presents the emergency department with atypical chest pain.  Currently she is chest pain-free.  Vitals are stable on arrival, she is sitting up and well-appearing.  EKG shows normal sinus rhythm with no acute ischemic changes.  Chest x-ray is unremarkable.  Blood work is reassuring with 2 - troponins and no delta.  No return of chest pain since in the department.  She is a heart score of 3 and low risk.  I find this atypical for ACS and do not think she meets criteria for emergent cardiology consult/admission or testing.  With no ongoing chest pain, shortness of breath, tachycardia or hypoxia is a very low suspicion for pulmonary embolism. Symptoms not concerning for aortic dissection.  I have discussed with the patient the importance of following up with her primary doctor for further cardiac follow-up and evaluation.  She understands, she already takes a full dose aspirin daily.  Patient will be discharged and treated as an outpatient.  Discharge plan and strict return to ED precautions discussed, patient verbalizes understanding and agreement.  Final Clinical Impression(s) / ED Diagnoses Final diagnoses:  None    Rx / DC Orders ED Discharge Orders    None       Abhijay Morriss, Alvin Critchley, DO 06/07/20 2000    Lorelle Gibbs, DO 06/07/20 2000    Reichen Hutzler, Alvin Critchley, DO 06/07/20 2001

## 2020-07-05 DIAGNOSIS — Z419 Encounter for procedure for purposes other than remedying health state, unspecified: Secondary | ICD-10-CM | POA: Diagnosis not present

## 2020-07-30 ENCOUNTER — Ambulatory Visit: Payer: Medicaid Other

## 2020-08-04 DIAGNOSIS — Z419 Encounter for procedure for purposes other than remedying health state, unspecified: Secondary | ICD-10-CM | POA: Diagnosis not present

## 2020-08-09 ENCOUNTER — Other Ambulatory Visit: Payer: Self-pay | Admitting: Internal Medicine

## 2020-08-09 DIAGNOSIS — E1165 Type 2 diabetes mellitus with hyperglycemia: Secondary | ICD-10-CM | POA: Diagnosis not present

## 2020-08-09 DIAGNOSIS — Z1211 Encounter for screening for malignant neoplasm of colon: Secondary | ICD-10-CM | POA: Diagnosis not present

## 2020-08-09 DIAGNOSIS — E7849 Other hyperlipidemia: Secondary | ICD-10-CM | POA: Diagnosis not present

## 2020-08-09 DIAGNOSIS — Z1239 Encounter for other screening for malignant neoplasm of breast: Secondary | ICD-10-CM | POA: Diagnosis not present

## 2020-08-09 DIAGNOSIS — E2839 Other primary ovarian failure: Secondary | ICD-10-CM | POA: Diagnosis not present

## 2020-08-09 DIAGNOSIS — N3080 Other cystitis without hematuria: Secondary | ICD-10-CM | POA: Diagnosis not present

## 2020-08-09 DIAGNOSIS — Z0000 Encounter for general adult medical examination without abnormal findings: Secondary | ICD-10-CM | POA: Diagnosis not present

## 2020-08-09 DIAGNOSIS — E559 Vitamin D deficiency, unspecified: Secondary | ICD-10-CM | POA: Diagnosis not present

## 2020-08-10 LAB — CBC
HCT: 37.6 % (ref 35.0–45.0)
Hemoglobin: 12.1 g/dL (ref 11.7–15.5)
MCH: 26.8 pg — ABNORMAL LOW (ref 27.0–33.0)
MCHC: 32.2 g/dL (ref 32.0–36.0)
MCV: 83.4 fL (ref 80.0–100.0)
MPV: 10.9 fL (ref 7.5–12.5)
Platelets: 292 10*3/uL (ref 140–400)
RBC: 4.51 10*6/uL (ref 3.80–5.10)
RDW: 12.5 % (ref 11.0–15.0)
WBC: 5.5 10*3/uL (ref 3.8–10.8)

## 2020-08-10 LAB — COMPLETE METABOLIC PANEL WITH GFR
AG Ratio: 1.7 (calc) (ref 1.0–2.5)
ALT: 19 U/L (ref 6–29)
AST: 19 U/L (ref 10–35)
Albumin: 4.5 g/dL (ref 3.6–5.1)
Alkaline phosphatase (APISO): 78 U/L (ref 37–153)
BUN: 14 mg/dL (ref 7–25)
CO2: 25 mmol/L (ref 20–32)
Calcium: 9.8 mg/dL (ref 8.6–10.4)
Chloride: 107 mmol/L (ref 98–110)
Creat: 0.81 mg/dL (ref 0.50–1.05)
GFR, Est African American: 97 mL/min/{1.73_m2} (ref >=60)
GFR, Est Non African American: 84 mL/min/{1.73_m2} (ref >=60)
Globulin: 2.6 g/dL (calc) (ref 1.9–3.7)
Glucose, Bld: 74 mg/dL (ref 65–99)
Potassium: 4.4 mmol/L (ref 3.5–5.3)
Sodium: 141 mmol/L (ref 135–146)
Total Bilirubin: 0.3 mg/dL (ref 0.2–1.2)
Total Protein: 7.1 g/dL (ref 6.1–8.1)

## 2020-08-10 LAB — LIPID PANEL
Cholesterol: 231 mg/dL — ABNORMAL HIGH (ref <200)
HDL: 41 mg/dL — ABNORMAL LOW (ref >=50)
LDL Cholesterol (Calc): 161 mg/dL (calc) — ABNORMAL HIGH
Non-HDL Cholesterol (Calc): 190 mg/dL (calc) — ABNORMAL HIGH (ref <130)
Total CHOL/HDL Ratio: 5.6 (calc) — ABNORMAL HIGH (ref <5.0)
Triglycerides: 144 mg/dL (ref <150)

## 2020-08-10 LAB — URINE CULTURE
MICRO NUMBER:: 11860005
Result:: NO GROWTH
SPECIMEN QUALITY:: ADEQUATE

## 2020-08-10 LAB — VITAMIN D 25 HYDROXY (VIT D DEFICIENCY, FRACTURES): Vit D, 25-Hydroxy: 51 ng/mL (ref 30–100)

## 2020-08-10 LAB — TSH: TSH: 1.03 mIU/L

## 2020-08-12 ENCOUNTER — Other Ambulatory Visit: Payer: Self-pay | Admitting: Internal Medicine

## 2020-08-12 DIAGNOSIS — E2839 Other primary ovarian failure: Secondary | ICD-10-CM

## 2020-08-20 DIAGNOSIS — Z20822 Contact with and (suspected) exposure to covid-19: Secondary | ICD-10-CM | POA: Diagnosis not present

## 2020-09-04 DIAGNOSIS — Z419 Encounter for procedure for purposes other than remedying health state, unspecified: Secondary | ICD-10-CM | POA: Diagnosis not present

## 2020-09-24 ENCOUNTER — Other Ambulatory Visit: Payer: Medicaid Other

## 2020-10-04 DIAGNOSIS — Z419 Encounter for procedure for purposes other than remedying health state, unspecified: Secondary | ICD-10-CM | POA: Diagnosis not present

## 2020-10-22 ENCOUNTER — Ambulatory Visit
Admission: RE | Admit: 2020-10-22 | Discharge: 2020-10-22 | Disposition: A | Discharge status: Home / Self Care | Payer: Medicaid Other | Source: Ambulatory Visit | Attending: Internal Medicine | Admitting: Internal Medicine

## 2020-10-22 ENCOUNTER — Other Ambulatory Visit: Payer: Self-pay

## 2020-10-22 DIAGNOSIS — Z1231 Encounter for screening mammogram for malignant neoplasm of breast: Secondary | ICD-10-CM

## 2020-11-04 DIAGNOSIS — Z419 Encounter for procedure for purposes other than remedying health state, unspecified: Secondary | ICD-10-CM | POA: Diagnosis not present

## 2020-12-05 DIAGNOSIS — Z419 Encounter for procedure for purposes other than remedying health state, unspecified: Secondary | ICD-10-CM | POA: Diagnosis not present

## 2021-01-04 DIAGNOSIS — Z419 Encounter for procedure for purposes other than remedying health state, unspecified: Secondary | ICD-10-CM | POA: Diagnosis not present

## 2021-01-20 ENCOUNTER — Emergency Department (HOSPITAL_COMMUNITY): Payer: Medicaid Other

## 2021-01-20 ENCOUNTER — Encounter (HOSPITAL_COMMUNITY): Payer: Self-pay

## 2021-01-20 ENCOUNTER — Emergency Department (HOSPITAL_COMMUNITY)
Admission: EM | Admit: 2021-01-20 | Discharge: 2021-01-21 | Disposition: A | Discharge status: Home / Self Care | Payer: Medicaid Other | Attending: Emergency Medicine | Admitting: Emergency Medicine

## 2021-01-20 ENCOUNTER — Other Ambulatory Visit: Payer: Self-pay

## 2021-01-20 DIAGNOSIS — Z7982 Long term (current) use of aspirin: Secondary | ICD-10-CM | POA: Insufficient documentation

## 2021-01-20 DIAGNOSIS — R0789 Other chest pain: Secondary | ICD-10-CM | POA: Diagnosis not present

## 2021-01-20 DIAGNOSIS — Z87891 Personal history of nicotine dependence: Secondary | ICD-10-CM | POA: Insufficient documentation

## 2021-01-20 DIAGNOSIS — R079 Chest pain, unspecified: Secondary | ICD-10-CM | POA: Diagnosis not present

## 2021-01-20 DIAGNOSIS — R42 Dizziness and giddiness: Secondary | ICD-10-CM | POA: Diagnosis not present

## 2021-01-20 LAB — BASIC METABOLIC PANEL
Anion gap: 6 (ref 5–15)
BUN: 15 mg/dL (ref 6–20)
CO2: 27 mmol/L (ref 22–32)
Calcium: 9.4 mg/dL (ref 8.9–10.3)
Chloride: 106 mmol/L (ref 98–111)
Creatinine, Ser: 0.79 mg/dL (ref 0.44–1.00)
GFR, Estimated: >60 mL/min (ref >60)
Glucose, Bld: 128 mg/dL — ABNORMAL HIGH (ref 70–99)
Potassium: 3.6 mmol/L (ref 3.5–5.1)
Sodium: 139 mmol/L (ref 135–145)

## 2021-01-20 LAB — CBC
HCT: 37 % (ref 36.0–46.0)
Hemoglobin: 11.9 g/dL — ABNORMAL LOW (ref 12.0–15.0)
MCH: 27.1 pg (ref 26.0–34.0)
MCHC: 32.2 g/dL (ref 30.0–36.0)
MCV: 84.3 fL (ref 80.0–100.0)
Platelets: 286 10*3/uL (ref 150–400)
RBC: 4.39 MIL/uL (ref 3.87–5.11)
RDW: 12.6 % (ref 11.5–15.5)
WBC: 6.3 10*3/uL (ref 4.0–10.5)
nRBC: 0 % (ref 0.0–0.2)

## 2021-01-20 LAB — I-STAT BETA HCG BLOOD, ED (MC, WL, AP ONLY): I-stat hCG, quantitative: <5 m[IU]/mL (ref <5)

## 2021-01-20 LAB — TROPONIN I (HIGH SENSITIVITY): Troponin I (High Sensitivity): 3 ng/L (ref <18)

## 2021-01-20 NOTE — ED Triage Notes (Signed)
Patient said she started feeling dizzy today and every now and then she feels a pinch in her chest. This started today. Patient has high cholesterol. No difficulty breathing.

## 2021-01-21 MED ORDER — MECLIZINE HCL 25 MG PO TABS
25.0000 mg | ORAL_TABLET | Freq: Once | ORAL | Status: AC
  Administered 2021-01-21: 25 mg via ORAL
  Filled 2021-01-21: qty 1

## 2021-01-21 MED ORDER — MECLIZINE HCL 25 MG PO TABS: 25.0000 mg | ORAL_TABLET | Freq: Three times a day (TID) | ORAL | 0 refills | Status: DC | PRN

## 2021-01-21 NOTE — ED Notes (Signed)
Patient has no concerns after AVS has been reviewed and patient education provided. Patient discharged. 

## 2021-01-21 NOTE — ED Provider Notes (Signed)
[facility] DEPT Provider Note: Audrey Spurling, MD, FACEP  CSN: [id number] MRN: [id number] ARRIVAL: 01/20/21 at 2023 ROOM: [place]  Chest Pain and Dizziness   HISTORY OF PRESENT ILLNESS  01/21/21 4:47 AM Audrey Rodriguez is a 50 y.o. female who has been having about 2 days of dizziness.  The dizziness is described as vertigo-like sensation.  It comes and episodes lasting up to about 20 minutes.  Symptoms are not severe and she is not vomiting with these.  Symptoms are worse with certain movements of her head.  She denies lightheadedness or a sensation that she might pass out.  She has also been having brief, sharp pains in her left upper chest.  These sharp pains are transient, lasting about a second.  Nothing brings them on.  They are not associated with shortness of breath, nausea or diaphoresis.  They do not occur very frequently.  The pain is not severe.   Past Medical History:  Diagnosis Date   Anemia    Ankle swelling    for 5 years   Anxiety    Arthritis    swelling in left ankle at times    GERD (gastroesophageal reflux disease)    no meds   Heart murmur    Hemorrhoids    since child birth, 25 years ago   Hypercholesteremia     Past Surgical History:  Procedure Laterality Date   CESAREAN SECTION     x 5    Family History  Problem Relation Age of Onset   Heart disease Mother    Diabetes Father    Kidney disease Father    Colon cancer Neg Hx    Colon polyps Neg Hx    Stomach cancer Neg Hx    Esophageal cancer Neg Hx    Rectal cancer Neg Hx     Social History   Tobacco Use   Smoking status: Former    Types: Cigarettes    Quit date: 12/28/1997    Years since quitting: 23.0   Smokeless tobacco: Never  Vaping Use   Vaping Use: Never used  Substance Use Topics   Alcohol use: No   Drug use: No    Prior to Admission medications   Medication Sig Start Date End Date Taking? Authorizing Provider  aspirin 325 MG tablet Take 325 mg by  mouth daily.   Yes [provider]  meclizine (ANTIVERT) 25 MG tablet Take 1 tablet (25 mg total) by mouth 3 (three) times daily as needed for dizziness. 01/21/21  Yes Karmon Andis, MD  tiZANidine (ZANAFLEX) 4 MG tablet Take 1 tablet (4 mg total) by mouth every 6 (six) hours as needed for muscle spasms. 01/24/20  Yes Wieters, Hallie C, PA-C  simvastatin (ZOCOR) 40 MG tablet Take 20 mg by mouth at bedtime.    [provider]  Vitamin D, Ergocalciferol, (DRISDOL) 1.25 MG (50000 UNIT) CAPS capsule Take 50,000 Units by mouth every 7 (seven) days. on Sunday 11/25/20   [provider]    Allergies Patient has no known allergies.   REVIEW OF SYSTEMS  Negative except as noted here or in the History of Present Illness.   PHYSICAL EXAMINATION  Initial Vital Signs Blood pressure (!) 181/92, pulse 62, temperature 97.6 F (36.4 C), resp. rate 17, height 5\' 1"  (1.549 m), weight 77.1 kg, SpO2 99 %.  Examination General: Well-developed, well-nourished female in no acute distress; appearance consistent with age of record HENT: normocephalic; atraumatic Eyes: pupils equal,  round and reactive to light; extraocular muscles intact; no nystagmus Neck: supple Heart: regular rate and rhythm Lungs: clear to auscultation bilaterally Chest: Nontender Abdomen: soft; nondistended; nontender; bowel sounds present Extremities: No deformity; full range of motion; pulses normal Neurologic: Awake, alert and oriented; motor function intact in all extremities and symmetric; no facial droop Skin: Warm and dry Psychiatric: Normal mood and affect   RESULTS  Summary of this visit's results, reviewed and interpreted by myself:   EKG Interpretation  Date/Time:  Monday January 20 2021 21:20:33 EDT Ventricular Rate:  73 PR Interval:  151 QRS Duration: 87 QT Interval:  380 QTC Calculation: 419 R Axis:   -6 Text Interpretation: Sinus rhythm Normal ECG No significant change was found  Confirmed by Shanon Rosser [phone]) on 01/21/2021 4:35:44 AM       Laboratory Studies: Results for orders placed or performed during the hospital encounter of 01/20/21 (from the past 24 hour(s))  I-Stat beta hCG blood, ED     Status: None   Collection Time: 01/20/21  9:44 PM  Result Value Ref Range   I-stat hCG, quantitative <5.0 <5 mIU/mL   Comment 3          Basic metabolic panel     Status: Abnormal   Collection Time: 01/20/21  9:45 PM  Result Value Ref Range   Sodium 139 135 - 145 mmol/L   Potassium 3.6 3.5 - 5.1 mmol/L   Chloride 106 98 - 111 mmol/L   CO2 27 22 - 32 mmol/L   Glucose, Bld 128 (H) 70 - 99 mg/dL   BUN 15 6 - 20 mg/dL   Creatinine, Ser 0.79 0.44 - 1.00 mg/dL   Calcium 9.4 8.9 - 10.3 mg/dL   GFR, Estimated >60 >60 mL/min   Anion gap 6 5 - 15  CBC     Status: Abnormal   Collection Time: 01/20/21  9:45 PM  Result Value Ref Range   WBC 6.3 4.0 - 10.5 K/uL   RBC 4.39 3.87 - 5.11 MIL/uL   Hemoglobin 11.9 (L) 12.0 - 15.0 g/dL   HCT 37.0 36.0 - 46.0 %   MCV 84.3 80.0 - 100.0 fL   MCH 27.1 26.0 - 34.0 pg   MCHC 32.2 30.0 - 36.0 g/dL   RDW 12.6 11.5 - 15.5 %   Platelets 286 150 - 400 K/uL   nRBC 0.0 0.0 - 0.2 %  Troponin I (High Sensitivity)     Status: None   Collection Time: 01/20/21  9:45 PM  Result Value Ref Range   Troponin I (High Sensitivity) 3 <18 ng/L   Imaging Studies: DG Chest 2 View  Result Date: 01/20/2021 CLINICAL DATA:  Chest pain EXAM: CHEST - 2 VIEW COMPARISON:  June 07, 2020 FINDINGS: The heart size and mediastinal contours are within normal limits. No focal consolidation. No pleural effusion. No pneumothorax. The visualized skeletal structures are unremarkable. IMPRESSION: No active cardiopulmonary disease. Electronically Signed   By: Dahlia Bailiff M.D.   On: 01/20/2021 22:58    ED COURSE and MDM  Nursing notes, initial and subsequent vitals signs, including pulse oximetry, reviewed and interpreted by myself.  Vitals:   01/20/21 2117  01/20/21 2118 01/21/21 0220 01/21/21 0445  BP:  (!) 166/101 (!) 186/108 (!) 181/92  Pulse: 74  62 62  Resp: 19  17 17   Temp: 98.2 F (36.8 C)  (!) 97.5 F (36.4 C) 97.6 F (36.4 C)  TempSrc: Oral  Oral   SpO2: 98%  100% 99%  Weight:      Height:       Medications  meclizine (ANTIVERT) tablet 25 mg (has no administration in time range)    The patient is dizziness is consistent with mild vertigo as it does change with head position and is not present when still.  The symptoms are not severe.  We will trial meclizine.  The sharp, transient chest pains do not appear to be related to her dizziness.  They are not consistent with cardiac etiology as she has an HEART score of only 2.  PROCEDURES  Procedures   ED DIAGNOSES     ICD-10-CM   1. Vertigo  R42     2. Atypical chest pain  R07.89          Shanon Rosser, MD 01/21/21 [phone]

## 2021-01-22 ENCOUNTER — Telehealth: Payer: Self-pay

## 2021-01-22 NOTE — Telephone Encounter (Signed)
Transition Care Management Unsuccessful Follow-up Telephone Call  Date of discharge and from where:  01/21/2021-[place]  Attempts:  1st Attempt  Reason for unsuccessful TCM follow-up call:  Unable to reach patient

## 2021-01-23 NOTE — Telephone Encounter (Signed)
Transition Care Management Follow-up Telephone Call Date of discharge and from where: 01/21/2021 from [facility] How have you been since you were released from the hospital? Pt stated that she is feeling much better and did not have any questions or concerns at this time.  Any questions or concerns? No  Items Reviewed: Did the pt receive and understand the discharge instructions provided? Yes  Medications obtained and verified? Yes  Other? No  Any new allergies since your discharge? No  Dietary orders reviewed? No Do you have support at home? Yes   Functional Questionnaire: (I = Independent and D = Dependent) ADLs: I  Bathing/Dressing- I  Meal Prep- I  Eating- I  Maintaining continence- I  Transferring/Ambulation- I  Managing Meds- I   Follow up appointments reviewed:  PCP Hospital f/u appt confirmed? No   Specialist Hospital f/u appt confirmed? No   Are transportation arrangements needed? No  If their condition worsens, is the pt aware to call PCP or go to the Emergency Dept.? Yes Was the patient provided with contact information for the PCP's office or ED? Yes Was to pt encouraged to call back with questions or concerns? Yes

## 2021-02-04 DIAGNOSIS — Z419 Encounter for procedure for purposes other than remedying health state, unspecified: Secondary | ICD-10-CM | POA: Diagnosis not present

## 2021-02-13 ENCOUNTER — Ambulatory Visit: Payer: Medicaid Other | Admitting: Obstetrics

## 2021-03-06 DIAGNOSIS — Z419 Encounter for procedure for purposes other than remedying health state, unspecified: Secondary | ICD-10-CM | POA: Diagnosis not present

## 2021-04-06 DIAGNOSIS — Z419 Encounter for procedure for purposes other than remedying health state, unspecified: Secondary | ICD-10-CM | POA: Diagnosis not present

## 2021-04-08 ENCOUNTER — Ambulatory Visit: Payer: Medicaid Other | Admitting: Nurse Practitioner

## 2021-04-23 DIAGNOSIS — D511 Vitamin B12 deficiency anemia due to selective vitamin B12 malabsorption with proteinuria: Secondary | ICD-10-CM | POA: Diagnosis not present

## 2021-04-30 ENCOUNTER — Other Ambulatory Visit (HOSPITAL_COMMUNITY)
Admission: RE | Admit: 2021-04-30 | Discharge: 2021-04-30 | Disposition: A | Discharge status: Home / Self Care | Payer: Medicaid Other | Source: Ambulatory Visit | Attending: Nurse Practitioner | Admitting: Nurse Practitioner

## 2021-04-30 ENCOUNTER — Encounter: Payer: Self-pay | Admitting: Nurse Practitioner

## 2021-04-30 ENCOUNTER — Other Ambulatory Visit: Payer: Self-pay

## 2021-04-30 ENCOUNTER — Ambulatory Visit (INDEPENDENT_AMBULATORY_CARE_PROVIDER_SITE_OTHER): Payer: Medicaid Other | Admitting: Nurse Practitioner

## 2021-04-30 VITALS — BP 139/83 | HR 73 | Ht 61.0 in | Wt 169.2 lb

## 2021-04-30 DIAGNOSIS — N939 Abnormal uterine and vaginal bleeding, unspecified: Secondary | ICD-10-CM

## 2021-04-30 DIAGNOSIS — Z01419 Encounter for gynecological examination (general) (routine) without abnormal findings: Secondary | ICD-10-CM | POA: Diagnosis not present

## 2021-04-30 DIAGNOSIS — F419 Anxiety disorder, unspecified: Secondary | ICD-10-CM | POA: Insufficient documentation

## 2021-04-30 DIAGNOSIS — K219 Gastro-esophageal reflux disease without esophagitis: Secondary | ICD-10-CM | POA: Insufficient documentation

## 2021-04-30 DIAGNOSIS — E119 Type 2 diabetes mellitus without complications: Secondary | ICD-10-CM | POA: Insufficient documentation

## 2021-04-30 DIAGNOSIS — B009 Herpesviral infection, unspecified: Secondary | ICD-10-CM | POA: Insufficient documentation

## 2021-04-30 DIAGNOSIS — R768 Other specified abnormal immunological findings in serum: Secondary | ICD-10-CM | POA: Insufficient documentation

## 2021-04-30 NOTE — Progress Notes (Signed)
Pt is in the office for annual exam Last pap 09-01-19 Last mammogram 10-22-20 GAD-7= 1

## 2021-04-30 NOTE — Progress Notes (Signed)
GYNECOLOGY ANNUAL PREVENTATIVE CARE ENCOUNTER NOTE  Subjective:   Audrey Rodriguez is a 51 y.o. G13P5 female here for a routine annual gynecologic exam.  Current complaints: wants a pap smear today.   Denies abnormal vaginal discharge, pelvic pain, problems with intercourse or other gynecologic concerns.   Does have vaginal bleeding periodically.  Not her usual periods but has not had a one year period of time without vaginal bleeding.   Gynecologic History No LMP recorded. (Menstrual status: Perimenopausal). Contraception: tubal ligation Last Pap: may 2021. Results were: normal Last mammogram: 10-2020. Results were: normal  Obstetric History OB History  Gravida Para Term Preterm AB Living  5 5       5   SAB IAB Ectopic Multiple Live Births               # Outcome Date GA Lbr Len/2nd Weight Sex Delivery Anes PTL Lv  5 Para      CS-Unspec     4 Para      CS-Unspec     3 Para      CS-Unspec     2 Para      CS-Unspec     1 Para      CS-Unspec       Past Medical History:  Diagnosis Date   Anemia    Ankle swelling    for 5 years   Anxiety    Arthritis    swelling in left ankle at times    GERD (gastroesophageal reflux disease)    no meds   Heart murmur    Hemorrhoids    since child birth, 25 years ago   Hypercholesteremia     Past Surgical History:  Procedure Laterality Date   CESAREAN SECTION     x 5    Current Outpatient Medications on File Prior to Visit  Medication Sig Dispense Refill   aspirin 325 MG tablet Take 325 mg by mouth daily.     simvastatin (ZOCOR) 40 MG tablet Take 20 mg by mouth at bedtime.     Vitamin D, Ergocalciferol, (DRISDOL) 1.25 MG (50000 UNIT) CAPS capsule Take 50,000 Units by mouth every 7 (seven) days. on Sunday     ACCU-CHEK GUIDE test strip TEST GLUCOSE EVERY DAY     cyanocobalamin (,VITAMIN B-12,) 1000 MCG/ML injection Inject 1,000 mcg into the muscle every 30 (thirty) days.     meclizine (ANTIVERT) 25 MG tablet Take 1 tablet (25 mg total)  by mouth 3 (three) times daily as needed for dizziness. (Patient not taking: Reported on 04/30/2021) 30 tablet 0   metFORMIN (GLUCOPHAGE) 500 MG tablet Take 500 mg by mouth daily.     tiZANidine (ZANAFLEX) 4 MG tablet Take 1 tablet (4 mg total) by mouth every 6 (six) hours as needed for muscle spasms. (Patient not taking: Reported on 04/30/2021) 30 tablet 0   No current facility-administered medications on file prior to visit.    No Known Allergies  Social History   Socioeconomic History   Marital status: Legally Separated    Spouse name: Not on file   Number of children: Not on file   Years of education: Not on file   Highest education level: Not on file  Occupational History   Not on file  Tobacco Use   Smoking status: Former    Types: Cigarettes    Quit date: 12/28/1997    Years since quitting: 23.3   Smokeless tobacco: Never  Vaping Use   Vaping Use:  Never used  Substance and Sexual Activity   Alcohol use: No   Drug use: No   Sexual activity: Not Currently    Comment: BTL  Other Topics Concern   Not on file  Social History Narrative   Not on file   Social Determinants of Health   Financial Resource Strain: Not on file  Food Insecurity: Not on file  Transportation Needs: Not on file  Physical Activity: Not on file  Stress: Not on file  Social Connections: Not on file  Intimate Partner Violence: Not on file    Family History  Problem Relation Age of Onset   Heart disease Mother    Diabetes Father    Kidney disease Father    Colon cancer Neg Hx    Colon polyps Neg Hx    Stomach cancer Neg Hx    Esophageal cancer Neg Hx    Rectal cancer Neg Hx     The following portions of the patient's history were reviewed and updated as appropriate: allergies, current medications, past family history, past medical history, past social history, past surgical history and problem list.  Review of Systems Pertinent items noted in HPI and remainder of comprehensive ROS  otherwise negative.   Objective:  BP 139/83    Pulse 73    Ht 5\' 1"  (1.549 m)    Wt 169 lb 3.2 oz (76.7 kg)    BMI 31.97 kg/m  CONSTITUTIONAL: Well-developed, well-nourished female in no acute distress.  HENT:  Normocephalic, atraumatic, External right and left ear normal.  EYES: Conjunctivae and EOM are normal. Pupils are equal, round.  No scleral icterus.  NECK: Normal range of motion, supple, no masses.  Normal thyroid.  SKIN: Skin is warm and dry. No rash noted. Not diaphoretic. No erythema. No pallor. NEUROLOGIC: Alert and oriented to person, place, and time. Normal reflexes, muscle tone coordination. No cranial nerve deficit noted. PSYCHIATRIC: Normal mood and affect. Normal behavior. Normal judgment and thought content. CARDIOVASCULAR: Normal heart rate noted, regular rhythm RESPIRATORY: Clear to auscultation bilaterally. Effort and breath sounds normal, no problems with respiration noted. BREASTS: Symmetric in size. No masses, skin changes, nipple drainage, or lymphadenopathy. ABDOMEN: Soft, no distention noted.  No tenderness, rebound or guarding.  PELVIC: Normal appearing external genitalia; normal appearing vaginal mucosa and cervix.  No abnormal discharge noted.  Pap smear obtained.  Normal uterine size, no other palpable masses, no uterine or adnexal tenderness. MUSCULOSKELETAL: Normal range of motion. No tenderness.  No cyanosis, clubbing, or edema.    Assessment and Plan:  1. Encounter for annual routine gynecological examination Wanted pap done today.  Pap cone today and advised next one will likely be due in 3 years.  - Cytology - PAP( [facility]) - Cervicovaginal ancillary only( [facility])  2. Vaginal bleeding Possibly perimenopausal bleeding or bleeding due to infection.  Will rule out infection  - Cervicovaginal ancillary only( [facility])  3. Controlled type 2 diabetes mellitus without complication, without long-term current use of insulin ([place]) Sees PCP for  management Takes metformin sometimes Checking glucose levels, but advised to check with her MD to review her target ranges.  Concerned she may be over her targets and thinking her readings are normal.  Will follow up results of pap smear and manage accordingly. Routine preventative health maintenance measures emphasized. Please refer to After Visit Summary for other counseling recommendations.    Earlie Server, RN, MSN, NP-BC Nurse Practitioner, [place] for [facility]

## 2021-05-02 LAB — CERVICOVAGINAL ANCILLARY ONLY
Chlamydia: NEGATIVE
Comment: NEGATIVE
Comment: NORMAL
Neisseria Gonorrhea: NEGATIVE

## 2021-05-05 LAB — CYTOLOGY - PAP
Adequacy: ABSENT
Comment: NEGATIVE
Diagnosis: NEGATIVE
High risk HPV: NEGATIVE

## 2021-05-07 DIAGNOSIS — Z419 Encounter for procedure for purposes other than remedying health state, unspecified: Secondary | ICD-10-CM | POA: Diagnosis not present

## 2021-06-04 DIAGNOSIS — Z419 Encounter for procedure for purposes other than remedying health state, unspecified: Secondary | ICD-10-CM | POA: Diagnosis not present

## 2021-07-04 DIAGNOSIS — Z7689 Persons encountering health services in other specified circumstances: Secondary | ICD-10-CM | POA: Diagnosis not present

## 2021-07-05 DIAGNOSIS — Z419 Encounter for procedure for purposes other than remedying health state, unspecified: Secondary | ICD-10-CM | POA: Diagnosis not present

## 2021-08-04 DIAGNOSIS — Z419 Encounter for procedure for purposes other than remedying health state, unspecified: Secondary | ICD-10-CM | POA: Diagnosis not present

## 2021-09-04 DIAGNOSIS — Z419 Encounter for procedure for purposes other than remedying health state, unspecified: Secondary | ICD-10-CM | POA: Diagnosis not present

## 2021-10-04 DIAGNOSIS — Z419 Encounter for procedure for purposes other than remedying health state, unspecified: Secondary | ICD-10-CM | POA: Diagnosis not present

## 2021-10-15 ENCOUNTER — Other Ambulatory Visit: Payer: Self-pay | Admitting: Internal Medicine

## 2021-10-15 DIAGNOSIS — Z0000 Encounter for general adult medical examination without abnormal findings: Secondary | ICD-10-CM | POA: Diagnosis not present

## 2021-10-15 DIAGNOSIS — E7849 Other hyperlipidemia: Secondary | ICD-10-CM | POA: Diagnosis not present

## 2021-10-15 DIAGNOSIS — E1165 Type 2 diabetes mellitus with hyperglycemia: Secondary | ICD-10-CM | POA: Diagnosis not present

## 2021-10-15 DIAGNOSIS — E538 Deficiency of other specified B group vitamins: Secondary | ICD-10-CM | POA: Diagnosis not present

## 2021-10-15 DIAGNOSIS — Z1239 Encounter for other screening for malignant neoplasm of breast: Secondary | ICD-10-CM | POA: Diagnosis not present

## 2021-10-15 DIAGNOSIS — E559 Vitamin D deficiency, unspecified: Secondary | ICD-10-CM | POA: Diagnosis not present

## 2021-10-16 LAB — FOLATE: Folate: 15.1 ng/mL

## 2021-10-16 LAB — CBC
HCT: 36.2 % (ref 35.0–45.0)
Hemoglobin: 12 g/dL (ref 11.7–15.5)
MCH: 26.8 pg — ABNORMAL LOW (ref 27.0–33.0)
MCHC: 33.1 g/dL (ref 32.0–36.0)
MCV: 80.8 fL (ref 80.0–100.0)
MPV: 10.7 fL (ref 7.5–12.5)
Platelets: 305 10*3/uL (ref 140–400)
RBC: 4.48 10*6/uL (ref 3.80–5.10)
RDW: 13 % (ref 11.0–15.0)
WBC: 5.1 10*3/uL (ref 3.8–10.8)

## 2021-10-16 LAB — COMPLETE METABOLIC PANEL WITH GFR
AG Ratio: 1.7 (calc) (ref 1.0–2.5)
ALT: 17 U/L (ref 6–29)
AST: 17 U/L (ref 10–35)
Albumin: 4.7 g/dL (ref 3.6–5.1)
Alkaline phosphatase (APISO): 83 U/L (ref 37–153)
BUN: 11 mg/dL (ref 7–25)
CO2: 24 mmol/L (ref 20–32)
Calcium: 9.5 mg/dL (ref 8.6–10.4)
Chloride: 105 mmol/L (ref 98–110)
Creat: 0.71 mg/dL (ref 0.50–1.03)
Globulin: 2.7 g/dL (calc) (ref 1.9–3.7)
Glucose, Bld: 84 mg/dL (ref 65–99)
Potassium: 4.2 mmol/L (ref 3.5–5.3)
Sodium: 139 mmol/L (ref 135–146)
Total Bilirubin: 0.5 mg/dL (ref 0.2–1.2)
Total Protein: 7.4 g/dL (ref 6.1–8.1)
eGFR: 102 mL/min/{1.73_m2} (ref >=60)

## 2021-10-16 LAB — LIPID PANEL
Cholesterol: 229 mg/dL — ABNORMAL HIGH (ref <200)
HDL: 49 mg/dL — ABNORMAL LOW (ref >=50)
LDL Cholesterol (Calc): 162 mg/dL (calc) — ABNORMAL HIGH
Non-HDL Cholesterol (Calc): 180 mg/dL (calc) — ABNORMAL HIGH (ref <130)
Total CHOL/HDL Ratio: 4.7 (calc) (ref <5.0)
Triglycerides: 76 mg/dL (ref <150)

## 2021-10-16 LAB — VITAMIN D 25 HYDROXY (VIT D DEFICIENCY, FRACTURES): Vit D, 25-Hydroxy: 57 ng/mL (ref 30–100)

## 2021-10-16 LAB — VITAMIN B12: Vitamin B-12: 1283 pg/mL — ABNORMAL HIGH (ref 200–1100)

## 2021-11-04 DIAGNOSIS — Z419 Encounter for procedure for purposes other than remedying health state, unspecified: Secondary | ICD-10-CM | POA: Diagnosis not present

## 2021-11-14 DIAGNOSIS — E119 Type 2 diabetes mellitus without complications: Secondary | ICD-10-CM | POA: Diagnosis not present

## 2021-11-26 ENCOUNTER — Other Ambulatory Visit: Payer: Self-pay | Admitting: Internal Medicine

## 2021-11-26 DIAGNOSIS — Z1231 Encounter for screening mammogram for malignant neoplasm of breast: Secondary | ICD-10-CM

## 2021-12-05 DIAGNOSIS — Z419 Encounter for procedure for purposes other than remedying health state, unspecified: Secondary | ICD-10-CM | POA: Diagnosis not present

## 2021-12-18 ENCOUNTER — Ambulatory Visit: Payer: Medicaid Other

## 2021-12-23 ENCOUNTER — Ambulatory Visit: Payer: Medicaid Other

## 2021-12-24 DIAGNOSIS — U071 COVID-19: Secondary | ICD-10-CM | POA: Diagnosis not present

## 2021-12-30 ENCOUNTER — Ambulatory Visit
Admission: RE | Admit: 2021-12-30 | Discharge: 2021-12-30 | Disposition: A | Discharge status: Home / Self Care | Payer: Medicaid Other | Source: Ambulatory Visit | Attending: Internal Medicine | Admitting: Internal Medicine

## 2021-12-30 DIAGNOSIS — Z1231 Encounter for screening mammogram for malignant neoplasm of breast: Secondary | ICD-10-CM | POA: Diagnosis not present

## 2022-01-01 ENCOUNTER — Other Ambulatory Visit: Payer: Self-pay | Admitting: Internal Medicine

## 2022-01-01 ENCOUNTER — Other Ambulatory Visit: Payer: Self-pay | Admitting: Obstetrics and Gynecology

## 2022-01-01 DIAGNOSIS — R928 Other abnormal and inconclusive findings on diagnostic imaging of breast: Secondary | ICD-10-CM

## 2022-01-04 DIAGNOSIS — Z419 Encounter for procedure for purposes other than remedying health state, unspecified: Secondary | ICD-10-CM | POA: Diagnosis not present

## 2022-01-05 ENCOUNTER — Telehealth: Payer: Self-pay

## 2022-01-05 NOTE — Telephone Encounter (Signed)
Telephoned patient at mobile number, mailbox full.[facility] did not leave a message with contact information.

## 2022-01-15 ENCOUNTER — Other Ambulatory Visit: Payer: Medicaid Other

## 2022-01-27 ENCOUNTER — Ambulatory Visit
Admission: RE | Admit: 2022-01-27 | Discharge: 2022-01-27 | Disposition: A | Discharge status: Home / Self Care | Payer: Medicaid Other | Source: Ambulatory Visit | Attending: Obstetrics and Gynecology | Admitting: Obstetrics and Gynecology

## 2022-01-27 ENCOUNTER — Ambulatory Visit: Admission: RE | Admit: 2022-01-27 | Payer: Medicaid Other | Source: Ambulatory Visit

## 2022-01-27 DIAGNOSIS — R928 Other abnormal and inconclusive findings on diagnostic imaging of breast: Secondary | ICD-10-CM

## 2022-02-04 DIAGNOSIS — Z419 Encounter for procedure for purposes other than remedying health state, unspecified: Secondary | ICD-10-CM | POA: Diagnosis not present

## 2022-02-09 ENCOUNTER — Ambulatory Visit
Admission: RE | Admit: 2022-02-09 | Discharge: 2022-02-09 | Disposition: A | Discharge status: Home / Self Care | Payer: Medicaid Other | Source: Ambulatory Visit | Attending: Obstetrics and Gynecology | Admitting: Obstetrics and Gynecology

## 2022-02-09 ENCOUNTER — Ambulatory Visit: Admission: RE | Admit: 2022-02-09 | Payer: Medicaid Other | Source: Ambulatory Visit

## 2022-03-06 DIAGNOSIS — Z419 Encounter for procedure for purposes other than remedying health state, unspecified: Secondary | ICD-10-CM | POA: Diagnosis not present

## 2022-04-06 DIAGNOSIS — Z419 Encounter for procedure for purposes other than remedying health state, unspecified: Secondary | ICD-10-CM | POA: Diagnosis not present

## 2022-05-04 DIAGNOSIS — J302 Other seasonal allergic rhinitis: Secondary | ICD-10-CM | POA: Diagnosis not present

## 2022-05-04 DIAGNOSIS — E1165 Type 2 diabetes mellitus with hyperglycemia: Secondary | ICD-10-CM | POA: Diagnosis not present

## 2022-05-04 DIAGNOSIS — Z111 Encounter for screening for respiratory tuberculosis: Secondary | ICD-10-CM | POA: Diagnosis not present

## 2022-05-07 DIAGNOSIS — Z419 Encounter for procedure for purposes other than remedying health state, unspecified: Secondary | ICD-10-CM | POA: Diagnosis not present

## 2022-05-14 IMAGING — CR DG CHEST 2V
2 series · 2 of 2 positions shown · non-contrast
Comparison: April 23, 2017.

CLINICAL DATA: Chest pain.

EXAM:
CHEST - 2 VIEW

[w chest pa]
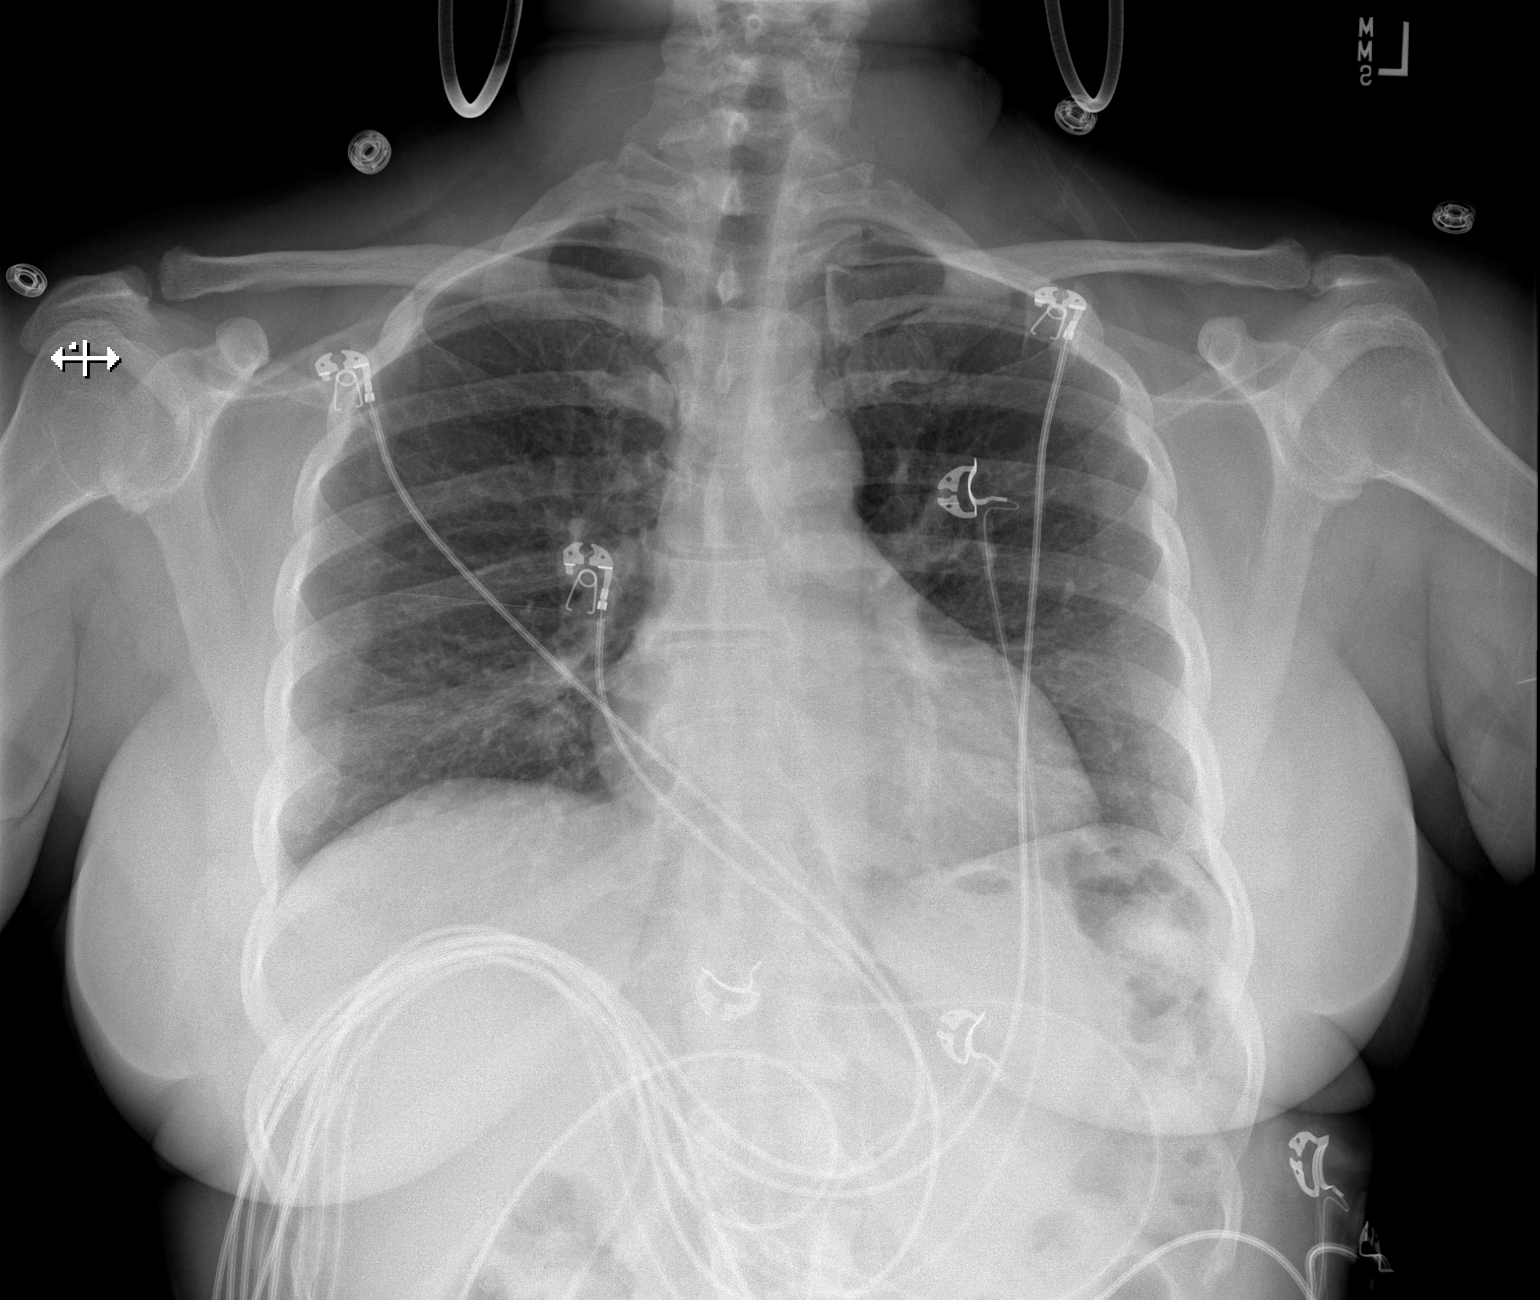

[w chest lat]
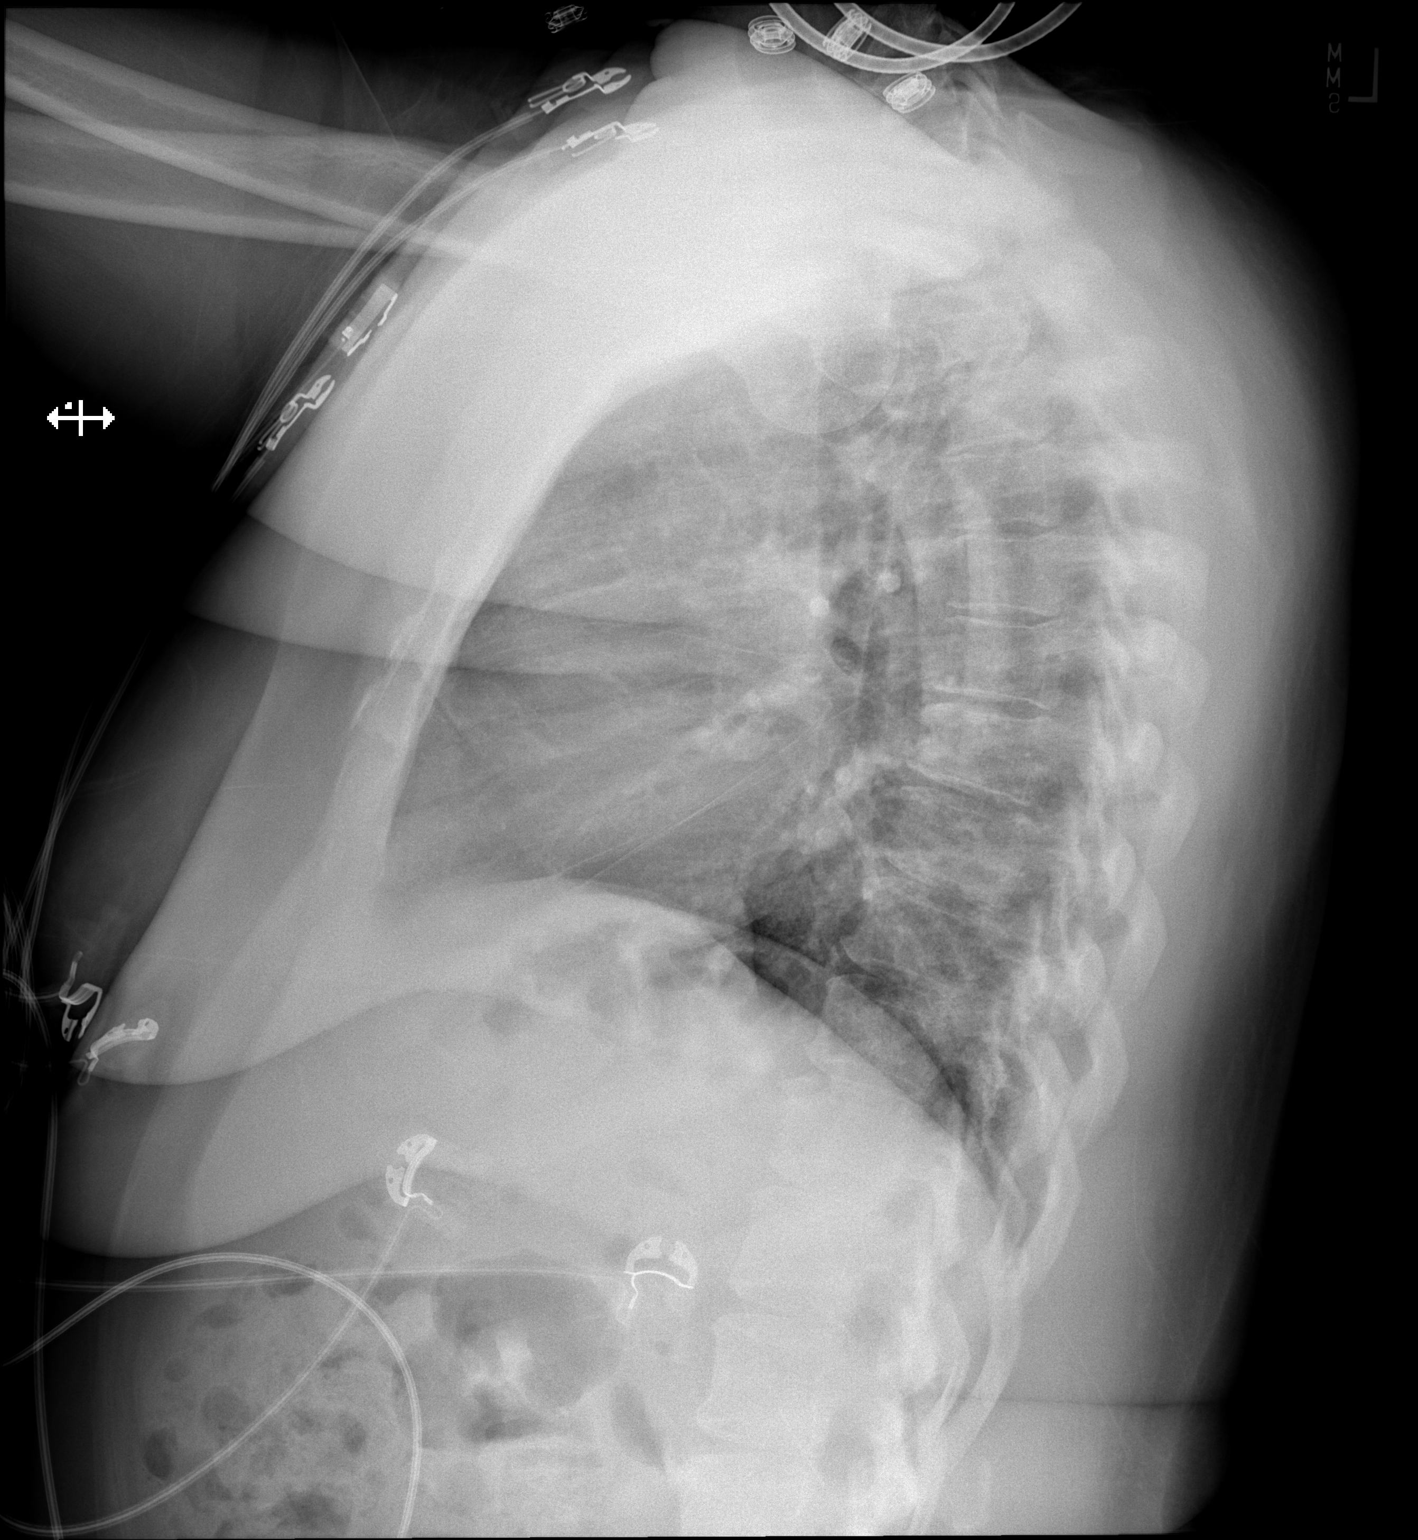

[2 of 2 positions shown; findings below may reference images not displayed]

FINDINGS: The heart size and mediastinal contours are within normal limits.
Both lungs are clear. No pneumothorax or pleural effusion is noted.
The visualized skeletal structures are unremarkable.
IMPRESSION: No active cardiopulmonary disease.

## 2022-06-05 DIAGNOSIS — Z419 Encounter for procedure for purposes other than remedying health state, unspecified: Secondary | ICD-10-CM | POA: Diagnosis not present

## 2022-06-19 DIAGNOSIS — E669 Obesity, unspecified: Secondary | ICD-10-CM | POA: Diagnosis not present

## 2022-06-19 DIAGNOSIS — Z6832 Body mass index (BMI) 32.0-32.9, adult: Secondary | ICD-10-CM | POA: Diagnosis not present

## 2022-06-19 DIAGNOSIS — E538 Deficiency of other specified B group vitamins: Secondary | ICD-10-CM | POA: Diagnosis not present

## 2022-06-19 DIAGNOSIS — E1165 Type 2 diabetes mellitus with hyperglycemia: Secondary | ICD-10-CM | POA: Diagnosis not present

## 2022-06-19 DIAGNOSIS — L700 Acne vulgaris: Secondary | ICD-10-CM | POA: Diagnosis not present

## 2022-07-06 DIAGNOSIS — Z419 Encounter for procedure for purposes other than remedying health state, unspecified: Secondary | ICD-10-CM | POA: Diagnosis not present

## 2022-08-05 DIAGNOSIS — Z419 Encounter for procedure for purposes other than remedying health state, unspecified: Secondary | ICD-10-CM | POA: Diagnosis not present

## 2022-09-19 ENCOUNTER — Emergency Department (HOSPITAL_COMMUNITY)
Admission: EM | Admit: 2022-09-19 | Discharge: 2022-09-19 | Disposition: A | Discharge status: Home / Self Care | Payer: Medicaid Other | Attending: Emergency Medicine | Admitting: Emergency Medicine

## 2022-09-19 ENCOUNTER — Other Ambulatory Visit: Payer: Self-pay

## 2022-09-19 ENCOUNTER — Encounter (HOSPITAL_COMMUNITY): Payer: Self-pay | Admitting: *Deleted

## 2022-09-19 DIAGNOSIS — Z7984 Long term (current) use of oral hypoglycemic drugs: Secondary | ICD-10-CM | POA: Insufficient documentation

## 2022-09-19 DIAGNOSIS — X509XXA Other and unspecified overexertion or strenuous movements or postures, initial encounter: Secondary | ICD-10-CM | POA: Diagnosis not present

## 2022-09-19 DIAGNOSIS — M79652 Pain in left thigh: Secondary | ICD-10-CM | POA: Diagnosis present

## 2022-09-19 DIAGNOSIS — E119 Type 2 diabetes mellitus without complications: Secondary | ICD-10-CM | POA: Diagnosis not present

## 2022-09-19 DIAGNOSIS — T148XXA Other injury of unspecified body region, initial encounter: Secondary | ICD-10-CM

## 2022-09-19 DIAGNOSIS — S76912A Strain of unspecified muscles, fascia and tendons at thigh level, left thigh, initial encounter: Secondary | ICD-10-CM | POA: Diagnosis not present

## 2022-09-19 DIAGNOSIS — Z7982 Long term (current) use of aspirin: Secondary | ICD-10-CM | POA: Insufficient documentation

## 2022-09-19 MED ORDER — MELOXICAM 7.5 MG PO TABS: 7.5000 mg | ORAL_TABLET | Freq: Every day | ORAL | 0 refills | Status: AC

## 2022-09-19 NOTE — ED Provider Notes (Signed)
[facility] EMERGENCY DEPARTMENT AT [facility] Provider Note   CSN: [id number] Arrival date & time: 09/19/22  1142     History  Chief Complaint  Patient presents with   Leg Pain    Audrey Rodriguez is a 52 y.o. female.  52 year old female with past medical history of diabetes hyperlipidemia presents with concern for posterior left thigh pain onset 1 month ago.  Also states that when she stretches she feels a muscle pulling on the left side of her chest.  She does not have any chest pain at rest or with exertion, no shortness of breath, also ongoing for 1 month.  Denies falls or injuries, leg swelling, history of PE or DVT, no recent extended travel, is a non-smoker, no cancer history, not on hormone therapy.       Home Medications Prior to Admission medications   Medication Sig Start Date End Date Taking? Authorizing Provider  meloxicam (MOBIC) 7.5 MG tablet Take 1 tablet (7.5 mg total) by mouth daily for 14 days. 09/19/22 10/03/22 Yes Jeannie Fend, PA-C  ACCU-CHEK GUIDE test strip TEST GLUCOSE EVERY DAY 04/08/21   [provider]  aspirin 325 MG tablet Take 325 mg by mouth daily.    [provider]  cyanocobalamin (,VITAMIN B-12,) 1000 MCG/ML injection Inject 1,000 mcg into the muscle every 30 (thirty) days. 04/08/21   [provider]  meclizine (ANTIVERT) 25 MG tablet Take 1 tablet (25 mg total) by mouth 3 (three) times daily as needed for dizziness. Patient not taking: Reported on 04/30/2021 01/21/21   Molpus, John, MD  metFORMIN (GLUCOPHAGE) 500 MG tablet Take 500 mg by mouth daily. 02/04/21   [provider]  simvastatin (ZOCOR) 40 MG tablet Take 20 mg by mouth at bedtime.    [provider]  tiZANidine (ZANAFLEX) 4 MG tablet Take 1 tablet (4 mg total) by mouth every 6 (six) hours as needed for muscle spasms. Patient not taking: Reported on 04/30/2021 01/24/20   Wieters, Fran Lowes C, PA-C  Vitamin D, Ergocalciferol, (DRISDOL) 1.25 MG  (50000 UNIT) CAPS capsule Take 50,000 Units by mouth every 7 (seven) days. on Sunday 11/25/20   [provider]      Allergies    Patient has no known allergies.    Review of Systems   Review of Systems Negative except as per HPI Physical Exam Updated Vital Signs BP (!) 158/98 (BP Location: Left Arm)   Pulse 71   Temp 98 F (36.7 C) (Oral)   Resp 15   Ht 5\' 1"  (1.549 m)   Wt 74.8 kg   SpO2 99%   BMI 31.18 kg/m  Physical Exam Vitals and nursing note reviewed.  Constitutional:      General: She is not in acute distress.    Appearance: She is well-developed. She is not diaphoretic.  HENT:     Head: Normocephalic and atraumatic.  Cardiovascular:     Pulses: Normal pulses.     Heart sounds: Normal heart sounds.  Pulmonary:     Effort: Pulmonary effort is normal.  Chest:     Chest wall: No tenderness.  Musculoskeletal:        General: Tenderness present. No swelling, deformity or signs of injury.     Right lower leg: No edema.     Left lower leg: No edema.       Legs:     Comments: TTP left posterior thigh, pain reproduced with hamstring stretch. No pain in the left groin,  medial thigh, popliteal space, calf. No lower extremity swelling, no palpable cords.   Skin:    General: Skin is warm and dry.     Findings: No erythema or rash.  Neurological:     Mental Status: She is alert and oriented to person, place, and time.     Sensory: No sensory deficit.     Motor: No weakness.     Gait: Gait normal.  Psychiatric:        Behavior: Behavior normal.     ED Results / Procedures / Treatments   Labs (all labs ordered are listed, but only abnormal results are displayed) Labs Reviewed - No data to display  EKG None  Radiology No results found.  Procedures Procedures    Medications Ordered in ED Medications - No data to display  ED Course/ Medical Decision Making/ A&P                             Medical Decision Making  52 year old female presents  with complaint of pain in her posterior left thigh that radiates down her leg. Pain is reproduced with hamstring stretch and palpation. No SI tenderness, calf pain or palpable cords. Doubt DVT, no risk factors, history and exam not consistent with DVT. Exam consistent with hamstring strain/pain. Recommend course of NSAID, warm compress and gentle stretching. Recheck with PCP if symptoms continue.  Left chest discomfort occurs only with stretching, for the past month. No discomfort at this time. HR RRR, no murmur appreciated. No associated cardiac symptoms. Question MSK origin of her discomfort. Return to ER for worsening or concerning symptoms, otherwise- recheck with PCP.         Final Clinical Impression(s) / ED Diagnoses Final diagnoses:  Muscle strain    Rx / DC Orders ED Discharge Orders          Ordered    meloxicam (MOBIC) 7.5 MG tablet  Daily        09/19/22 1207              Jeannie Fend, PA-C 09/19/22 1217    Alvira Monday, MD 09/21/22 1125

## 2022-09-19 NOTE — Discharge Instructions (Addendum)
Take Meloxicam daily with food. Heating pad for 20 minutes at a time. Follow with gentle stretching as discussed. Recheck with your doctor if not improving.

## 2022-09-19 NOTE — ED Triage Notes (Signed)
Pain in posterior left leg for about a month. No injury

## 2022-09-28 IMAGING — MG MM DIGITAL SCREENING BILAT W/ TOMO AND CAD
8 series · 8 of 24 positions shown · non-contrast
Comparison: Previous exam(s).

CLINICAL DATA: Screening.

EXAM:
DIGITAL SCREENING BILATERAL MAMMOGRAM WITH TOMOSYNTHESIS AND CAD
TECHNIQUE: Bilateral screening digital craniocaudal and mediolateral oblique
mammograms were obtained. Bilateral screening digital breast
tomosynthesis was performed. The images were evaluated with
computer-aided detection.

[L MLO synth-2D]
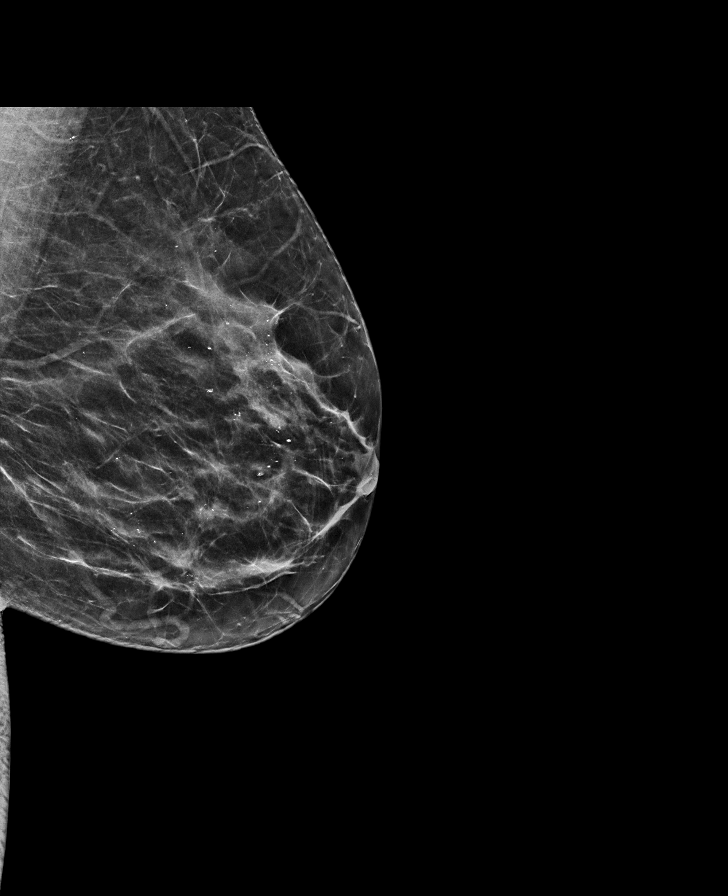

[R CC synth-2D]
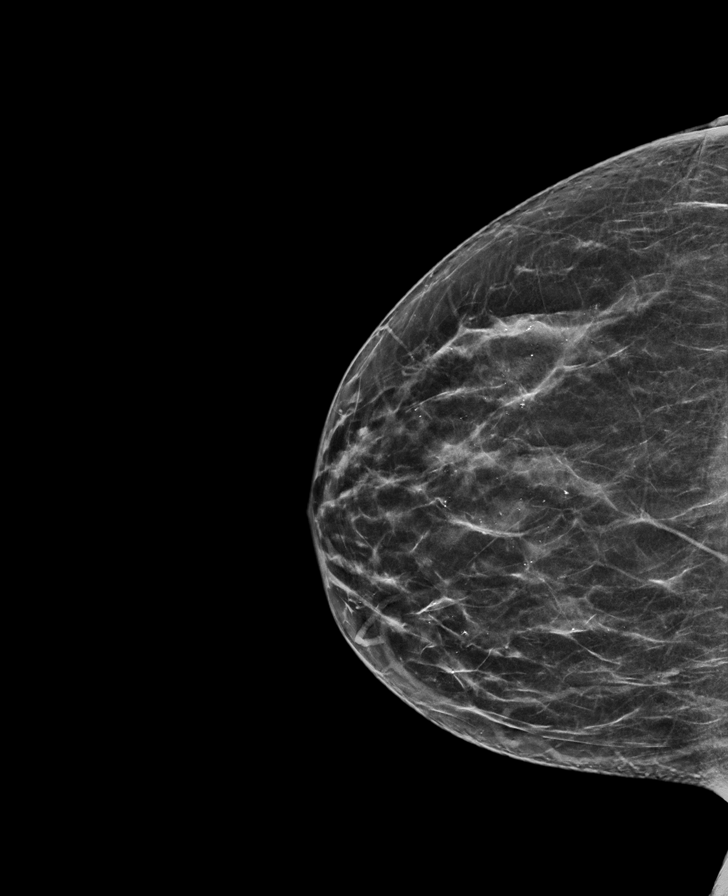

[R MLO synth-2D]
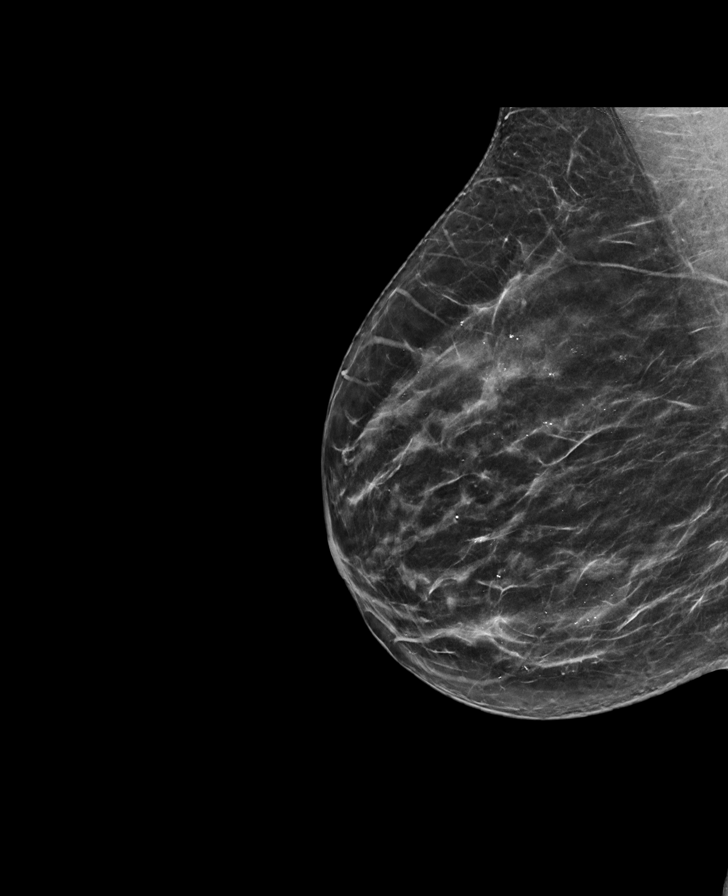

[L CC synth-2D]
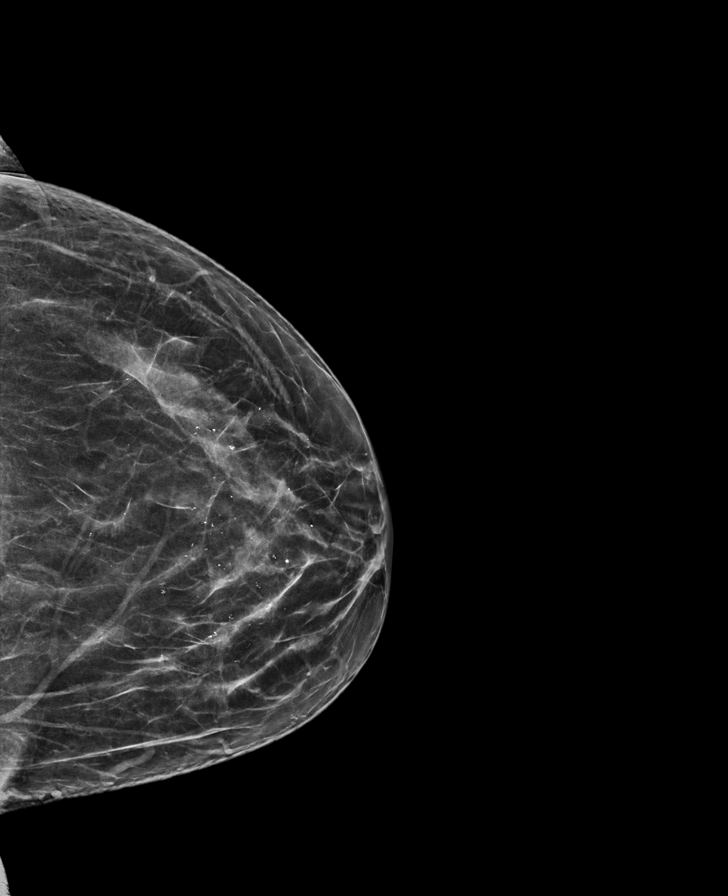

[R CC tomo · tomo slice 32/63.0]
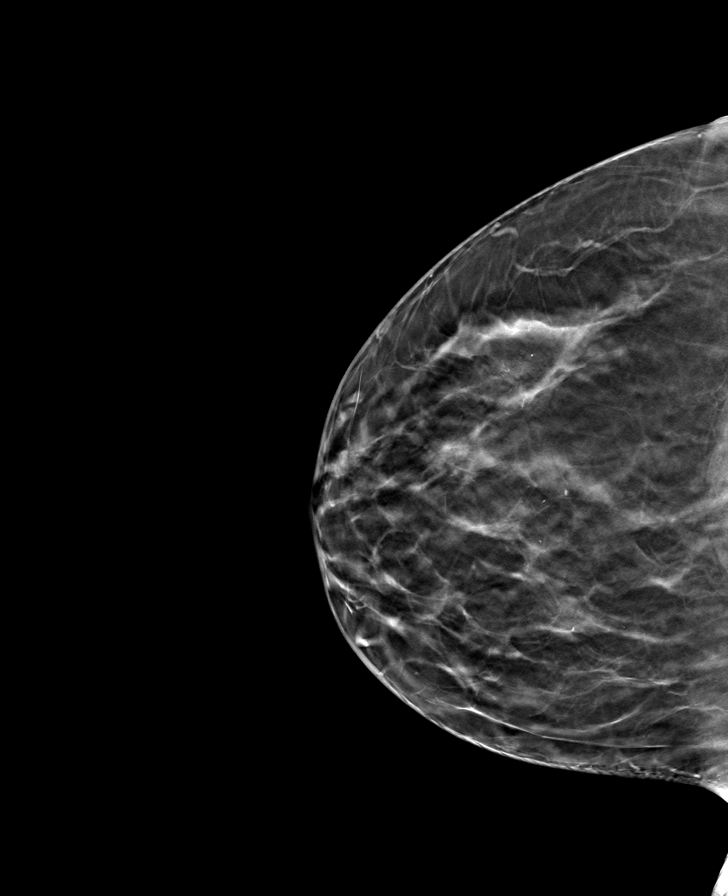

[L CC tomo · tomo slice 32/63.0]
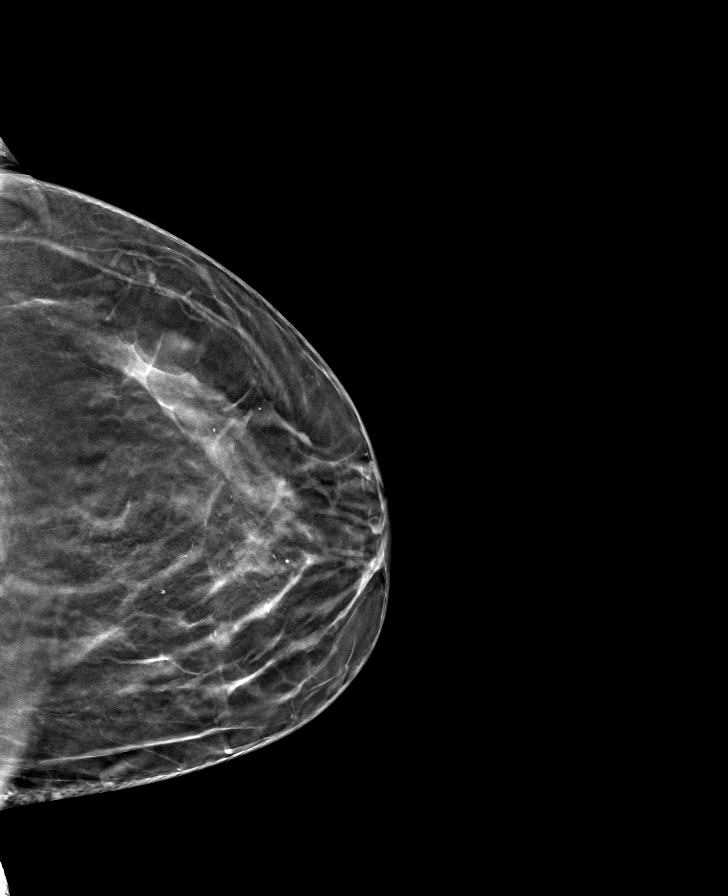

[L MLO tomo · tomo slice 31/62.0]
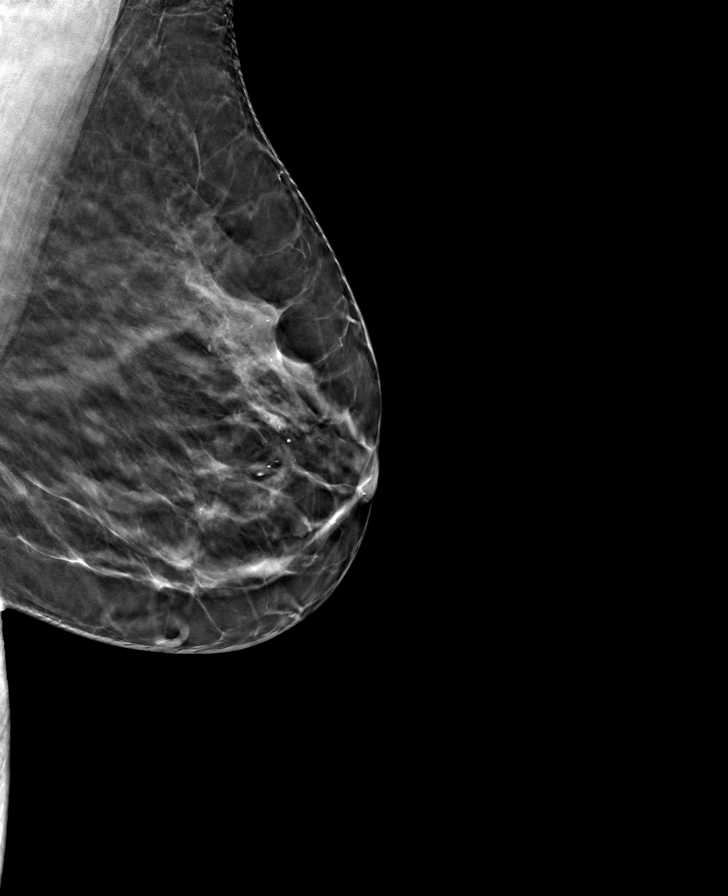

[R MLO tomo · tomo slice 33/66.0]
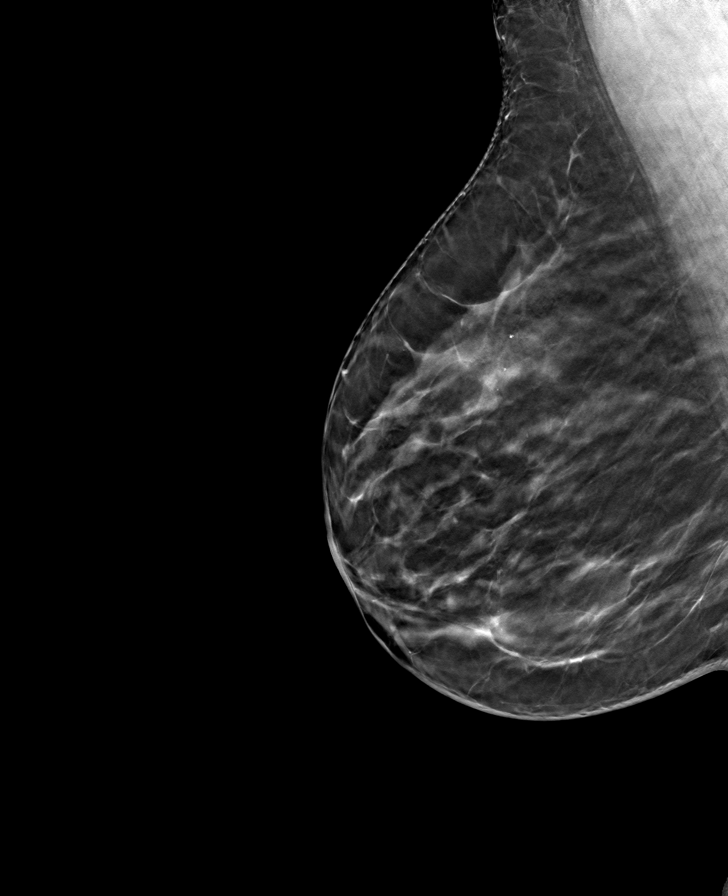

[8 of 24 positions shown; findings below may reference images not displayed]

ACR Breast Density Category b: There are scattered areas of
fibroglandular density.
FINDINGS: There are no findings suspicious for malignancy.
IMPRESSION: No mammographic evidence of malignancy. A result letter of this
screening mammogram will be mailed directly to the patient.

RECOMMENDATION:
Screening mammogram in one year. (Code:51-O-LD2)

BI-RADS CATEGORY  1: Negative.

## 2022-10-21 ENCOUNTER — Telehealth: Payer: Self-pay

## 2022-10-21 NOTE — Telephone Encounter (Signed)
LVM for patient to call back [phone], or to call PCP office to schedule follow up apt. AS, CMA  

## 2022-11-24 ENCOUNTER — Other Ambulatory Visit: Payer: Self-pay | Admitting: Internal Medicine

## 2022-11-25 LAB — COMPLETE METABOLIC PANEL WITH GFR
AG Ratio: 2 (calc) (ref 1.0–2.5)
ALT: 19 U/L (ref 6–29)
AST: 17 U/L (ref 10–35)
Albumin: 4.5 g/dL (ref 3.6–5.1)
Alkaline phosphatase (APISO): 73 U/L (ref 37–153)
BUN: 13 mg/dL (ref 7–25)
CO2: 25 mmol/L (ref 20–32)
Calcium: 9.3 mg/dL (ref 8.6–10.4)
Chloride: 106 mmol/L (ref 98–110)
Creat: 0.73 mg/dL (ref 0.50–1.03)
Globulin: 2.3 g/dL (ref 1.9–3.7)
Glucose, Bld: 83 mg/dL (ref 65–99)
Potassium: 4.1 mmol/L (ref 3.5–5.3)
Sodium: 139 mmol/L (ref 135–146)
Total Bilirubin: 0.3 mg/dL (ref 0.2–1.2)
Total Protein: 6.8 g/dL (ref 6.1–8.1)
eGFR: 98 mL/min/{1.73_m2} (ref >=60)

## 2022-11-25 LAB — CBC
HCT: 35 % (ref 35.0–45.0)
Hemoglobin: 11.3 g/dL — ABNORMAL LOW (ref 11.7–15.5)
MCH: 26.7 pg — ABNORMAL LOW (ref 27.0–33.0)
MCHC: 32.3 g/dL (ref 32.0–36.0)
MCV: 82.7 fL (ref 80.0–100.0)
MPV: 10.8 fL (ref 7.5–12.5)
Platelets: 278 10*3/uL (ref 140–400)
RBC: 4.23 10*6/uL (ref 3.80–5.10)
RDW: 12.9 % (ref 11.0–15.0)
WBC: 4.5 10*3/uL (ref 3.8–10.8)

## 2022-11-25 LAB — LIPID PANEL
Cholesterol: 219 mg/dL — ABNORMAL HIGH (ref <200)
HDL: 39 mg/dL — ABNORMAL LOW (ref >=50)
LDL Cholesterol (Calc): 141 mg/dL — ABNORMAL HIGH
Non-HDL Cholesterol (Calc): 180 mg/dL — ABNORMAL HIGH (ref <130)
Total CHOL/HDL Ratio: 5.6 (calc) — ABNORMAL HIGH (ref <5.0)
Triglycerides: 241 mg/dL — ABNORMAL HIGH (ref <150)

## 2022-11-25 LAB — VITAMIN D 25 HYDROXY (VIT D DEFICIENCY, FRACTURES): Vit D, 25-Hydroxy: 41 ng/mL (ref 30–100)

## 2022-11-25 LAB — VITAMIN B12: Vitamin B-12: 255 pg/mL (ref 200–1100)

## 2022-11-25 LAB — FOLATE: Folate: 14.2 ng/mL

## 2022-11-25 LAB — TSH: TSH: 0.94 m[IU]/L

## 2022-12-27 IMAGING — CR DG CHEST 2V
2 series · 2 of 2 positions shown · non-contrast
Comparison: June 07, 2020

CLINICAL DATA: Chest pain

EXAM:
CHEST - 2 VIEW

[w chest pa]
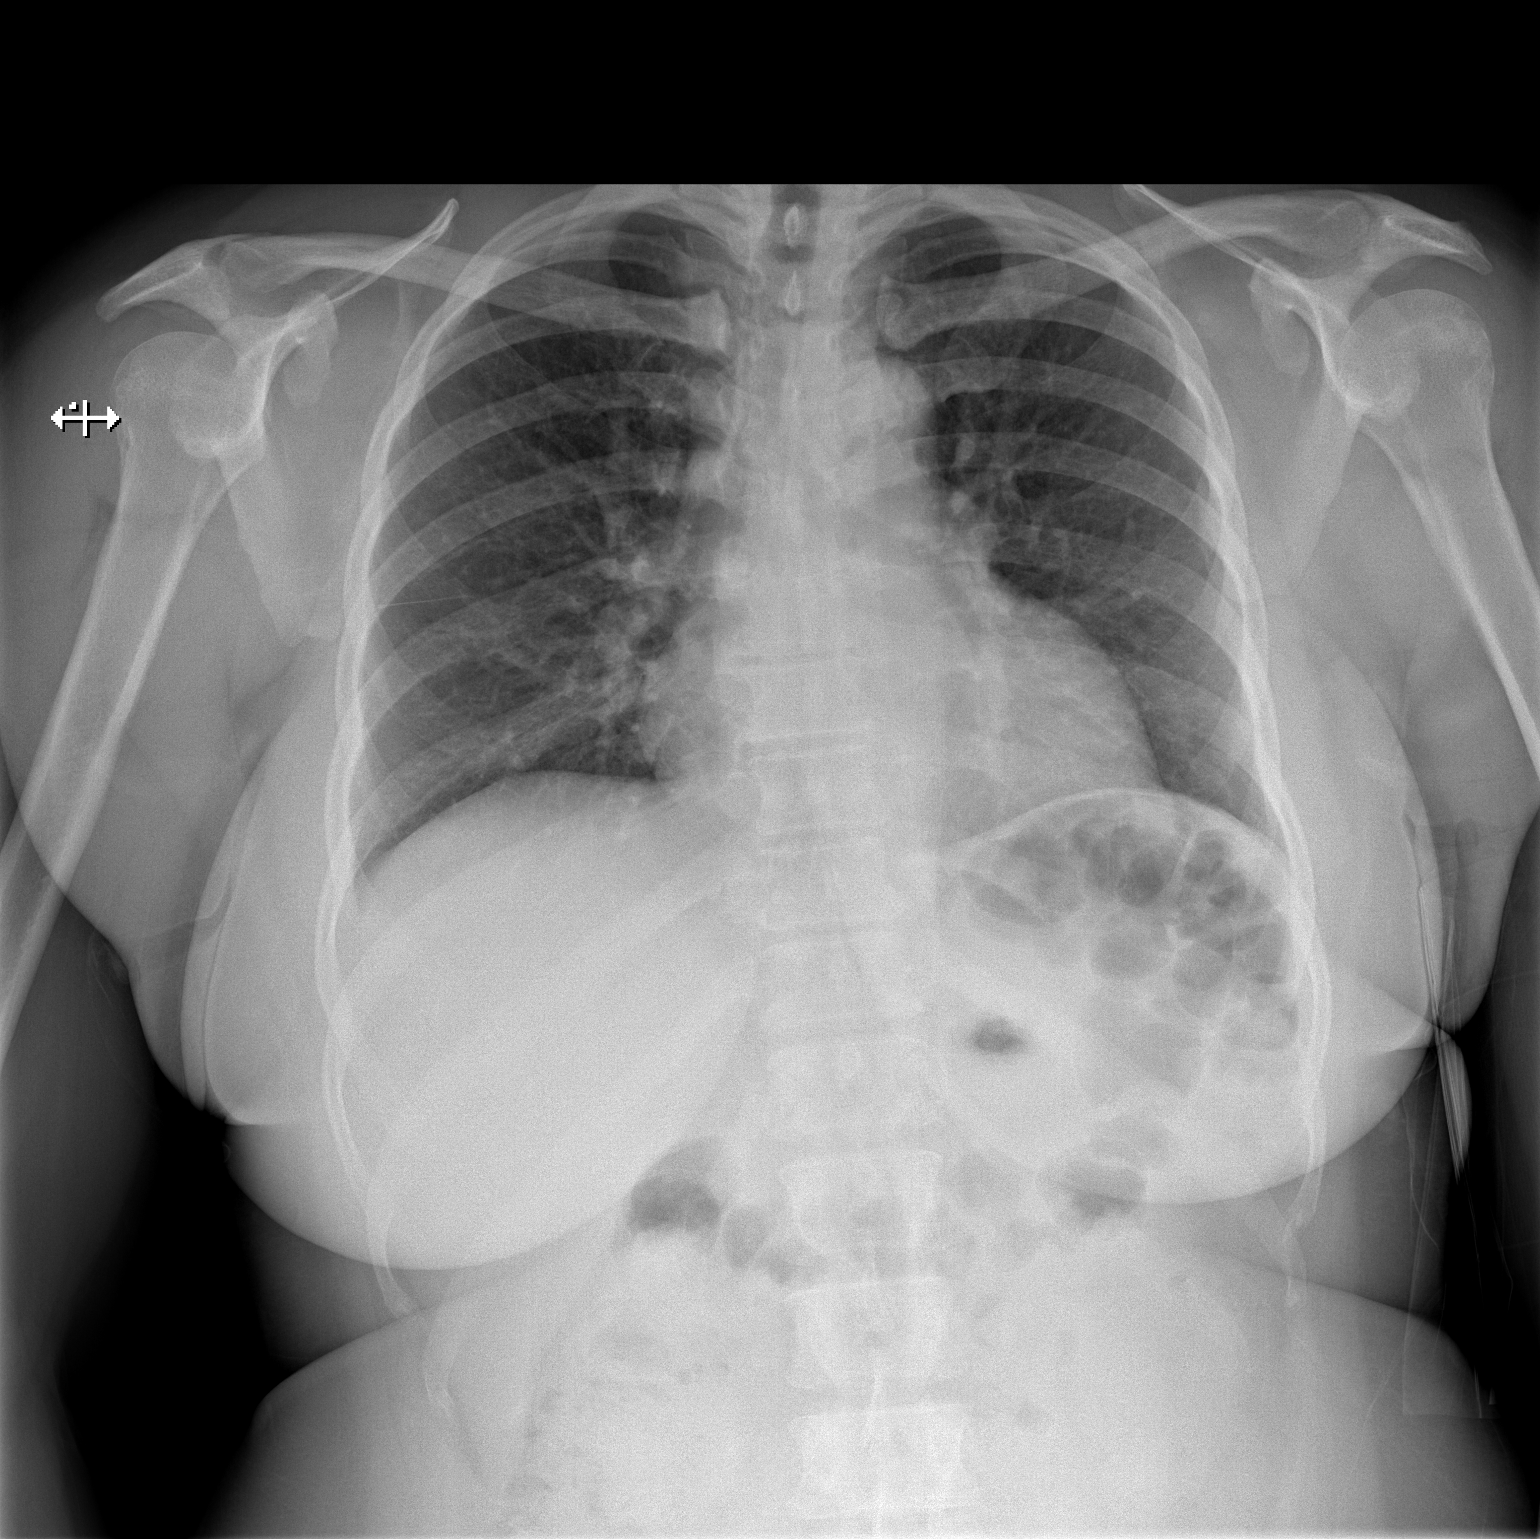

[w chest lat]
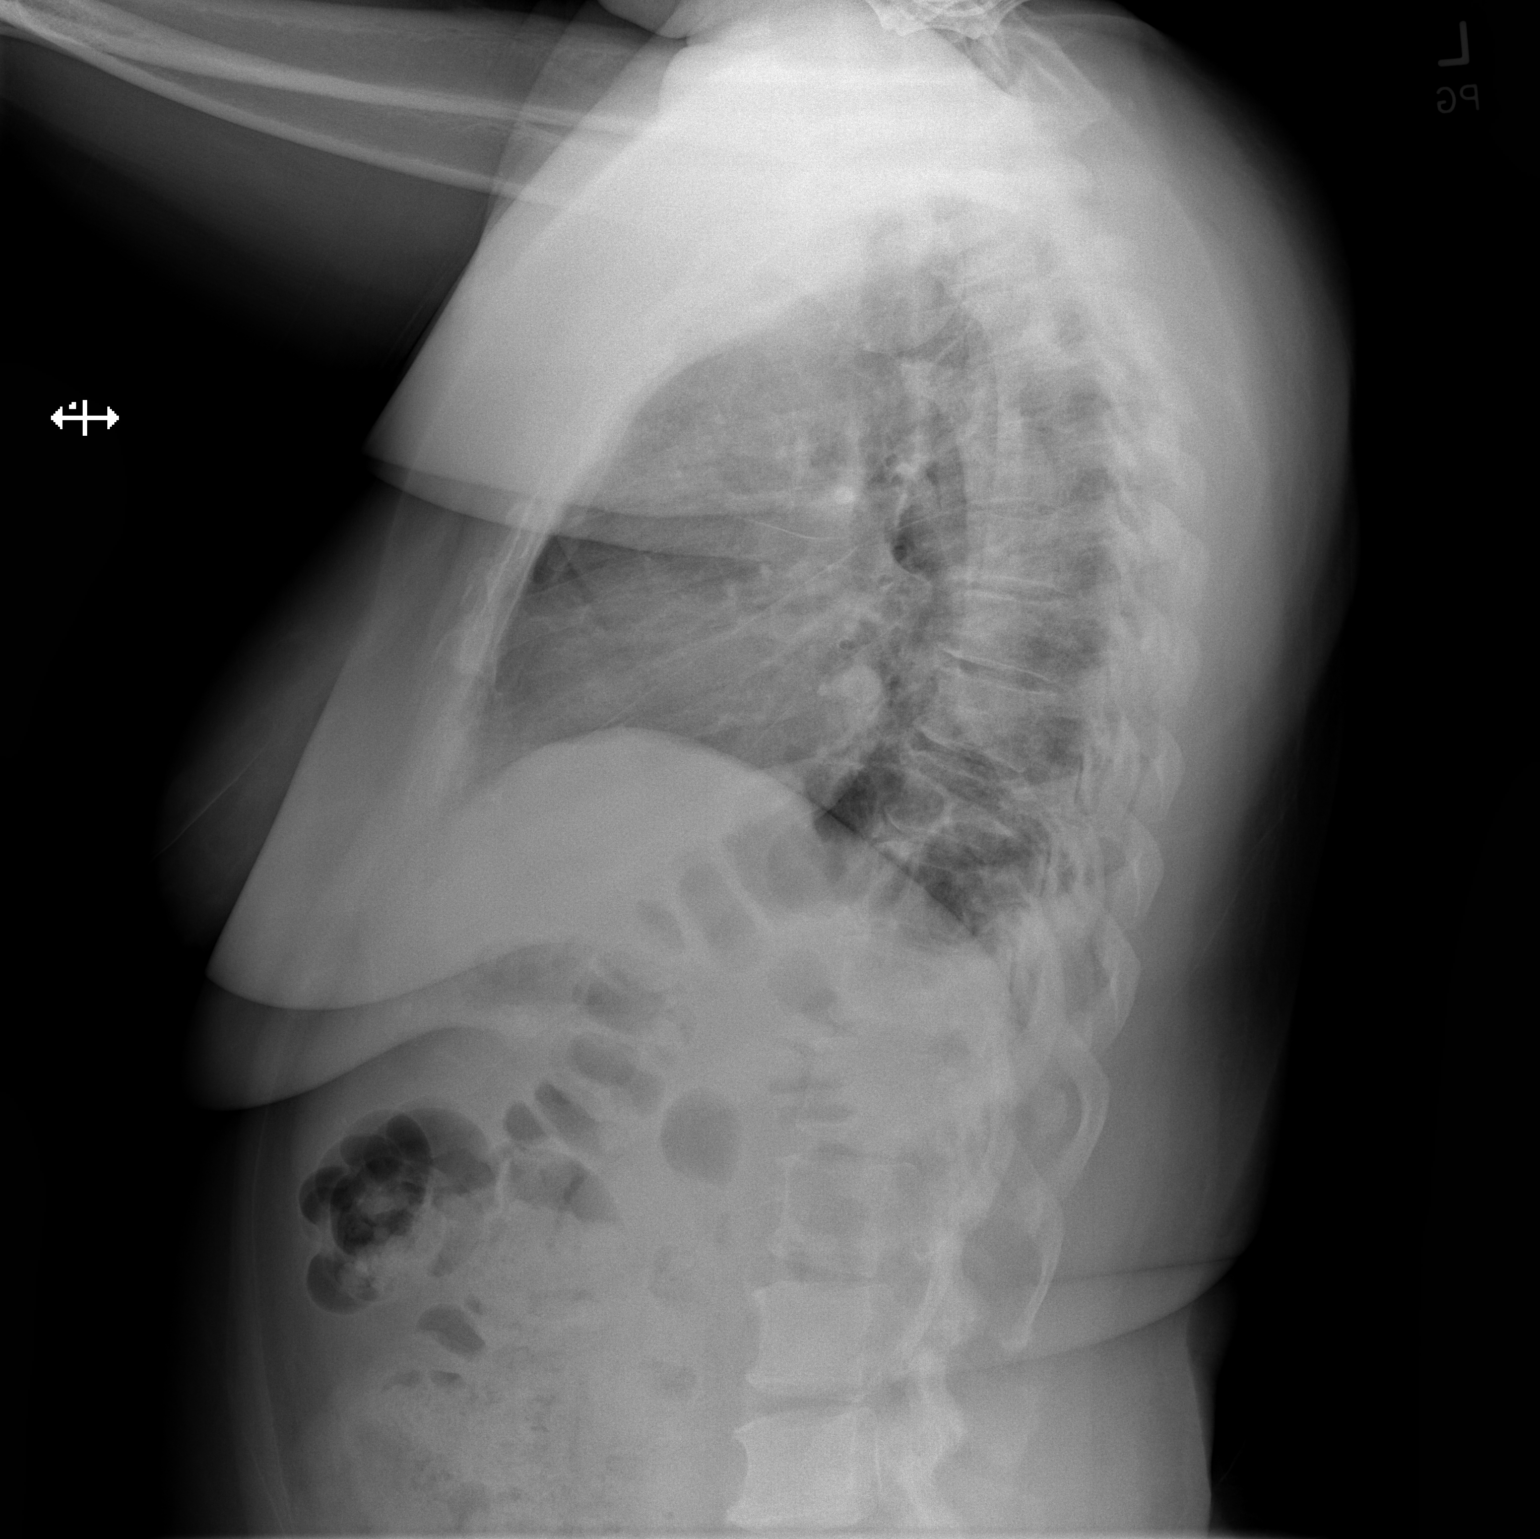

[2 of 2 positions shown; findings below may reference images not displayed]

FINDINGS: The heart size and mediastinal contours are within normal limits. No
focal consolidation. No pleural effusion. No pneumothorax. The
visualized skeletal structures are unremarkable.
IMPRESSION: No active cardiopulmonary disease.

## 2023-01-07 ENCOUNTER — Other Ambulatory Visit: Payer: Self-pay | Admitting: Internal Medicine

## 2023-01-07 DIAGNOSIS — Z0000 Encounter for general adult medical examination without abnormal findings: Secondary | ICD-10-CM

## 2023-01-09 ENCOUNTER — Emergency Department (HOSPITAL_COMMUNITY)
Admission: EM | Admit: 2023-01-09 | Discharge: 2023-01-09 | Disposition: A | Discharge status: Home / Self Care | Payer: Medicaid Other | Attending: Emergency Medicine | Admitting: Emergency Medicine

## 2023-01-09 ENCOUNTER — Emergency Department (HOSPITAL_COMMUNITY): Payer: Medicaid Other

## 2023-01-09 ENCOUNTER — Other Ambulatory Visit: Payer: Self-pay

## 2023-01-09 ENCOUNTER — Encounter (HOSPITAL_COMMUNITY): Payer: Self-pay

## 2023-01-09 DIAGNOSIS — E119 Type 2 diabetes mellitus without complications: Secondary | ICD-10-CM | POA: Insufficient documentation

## 2023-01-09 DIAGNOSIS — R1012 Left upper quadrant pain: Secondary | ICD-10-CM | POA: Diagnosis not present

## 2023-01-09 DIAGNOSIS — R112 Nausea with vomiting, unspecified: Secondary | ICD-10-CM | POA: Diagnosis not present

## 2023-01-09 DIAGNOSIS — R1013 Epigastric pain: Secondary | ICD-10-CM | POA: Insufficient documentation

## 2023-01-09 DIAGNOSIS — Z7984 Long term (current) use of oral hypoglycemic drugs: Secondary | ICD-10-CM | POA: Diagnosis not present

## 2023-01-09 DIAGNOSIS — Z7982 Long term (current) use of aspirin: Secondary | ICD-10-CM | POA: Insufficient documentation

## 2023-01-09 DIAGNOSIS — R1010 Upper abdominal pain, unspecified: Secondary | ICD-10-CM | POA: Diagnosis present

## 2023-01-09 LAB — URINALYSIS, ROUTINE W REFLEX MICROSCOPIC
Bacteria, UA: NONE SEEN
Bilirubin Urine: NEGATIVE
Glucose, UA: NEGATIVE mg/dL
Ketones, ur: 5 mg/dL — AB
Leukocytes,Ua: NEGATIVE
Nitrite: NEGATIVE
Protein, ur: NEGATIVE mg/dL
Specific Gravity, Urine: 1.017 (ref 1.005–1.030)
pH: 6 (ref 5.0–8.0)

## 2023-01-09 LAB — BASIC METABOLIC PANEL
Anion gap: 13 (ref 5–15)
BUN: 13 mg/dL (ref 6–20)
CO2: 23 mmol/L (ref 22–32)
Calcium: 9.1 mg/dL (ref 8.9–10.3)
Chloride: 106 mmol/L (ref 98–111)
Creatinine, Ser: 1.03 mg/dL — ABNORMAL HIGH (ref 0.44–1.00)
GFR, Estimated: >60 mL/min (ref >60)
Glucose, Bld: 150 mg/dL — ABNORMAL HIGH (ref 70–99)
Potassium: 3.5 mmol/L (ref 3.5–5.1)
Sodium: 142 mmol/L (ref 135–145)

## 2023-01-09 LAB — HEPATIC FUNCTION PANEL
ALT: 17 U/L (ref 0–44)
AST: 19 U/L (ref 15–41)
Albumin: 4 g/dL (ref 3.5–5.0)
Alkaline Phosphatase: 65 U/L (ref 38–126)
Bilirubin, Direct: <0.1 mg/dL (ref 0.0–0.2)
Total Bilirubin: 0.4 mg/dL (ref 0.3–1.2)
Total Protein: 7.2 g/dL (ref 6.5–8.1)

## 2023-01-09 LAB — CBC
HCT: 37.8 % (ref 36.0–46.0)
Hemoglobin: 11.7 g/dL — ABNORMAL LOW (ref 12.0–15.0)
MCH: 26.1 pg (ref 26.0–34.0)
MCHC: 31 g/dL (ref 30.0–36.0)
MCV: 84.4 fL (ref 80.0–100.0)
Platelets: 335 10*3/uL (ref 150–400)
RBC: 4.48 MIL/uL (ref 3.87–5.11)
RDW: 12.9 % (ref 11.5–15.5)
WBC: 7.5 10*3/uL (ref 4.0–10.5)
nRBC: 0 % (ref 0.0–0.2)

## 2023-01-09 LAB — TROPONIN I (HIGH SENSITIVITY)
Troponin I (High Sensitivity): 5 ng/L (ref <18)
Troponin I (High Sensitivity): 6 ng/L (ref <18)

## 2023-01-09 LAB — HCG, SERUM, QUALITATIVE: Preg, Serum: NEGATIVE

## 2023-01-09 LAB — LIPASE, BLOOD: Lipase: 21 U/L (ref 11–51)

## 2023-01-09 MED ORDER — FAMOTIDINE 20 MG PO TABS: 20.0000 mg | ORAL_TABLET | Freq: Two times a day (BID) | ORAL | 0 refills | Status: DC

## 2023-01-09 MED ORDER — ALUM & MAG HYDROXIDE-SIMETH 200-200-20 MG/5ML PO SUSP
30.0000 mL | Freq: Once | ORAL | Status: AC
  Administered 2023-01-09: 30 mL via ORAL
  Filled 2023-01-09: qty 30

## 2023-01-09 MED ORDER — FAMOTIDINE 20 MG PO TABS: 20.0000 mg | ORAL_TABLET | Freq: Two times a day (BID) | ORAL | 0 refills | Status: AC

## 2023-01-09 MED ORDER — PANTOPRAZOLE SODIUM 40 MG IV SOLR
40.0000 mg | Freq: Once | INTRAVENOUS | Status: AC
  Administered 2023-01-09: 40 mg via INTRAVENOUS
  Filled 2023-01-09: qty 10

## 2023-01-09 MED ORDER — LIDOCAINE VISCOUS HCL 2 % MT SOLN
15.0000 mL | Freq: Once | OROMUCOSAL | Status: AC
  Administered 2023-01-09: 15 mL via ORAL
  Filled 2023-01-09: qty 15

## 2023-01-09 MED ORDER — ONDANSETRON HCL 4 MG/2ML IJ SOLN
4.0000 mg | Freq: Once | INTRAMUSCULAR | Status: DC
  Filled 2023-01-09: qty 2

## 2023-01-09 MED ORDER — SODIUM CHLORIDE 0.9 % IV BOLUS
1000.0000 mL | Freq: Once | INTRAVENOUS | Status: AC
  Administered 2023-01-09: 1000 mL via INTRAVENOUS

## 2023-01-09 MED ORDER — KETOROLAC TROMETHAMINE 15 MG/ML IJ SOLN
15.0000 mg | Freq: Once | INTRAMUSCULAR | Status: AC
  Administered 2023-01-09: 15 mg via INTRAVENOUS
  Filled 2023-01-09: qty 1

## 2023-01-09 NOTE — ED Triage Notes (Signed)
Patient woke up this morning with pain across chest with mild SOB , no emesis or diaphoresis .

## 2023-01-09 NOTE — ED Provider Notes (Cosign Needed Addendum)
[place] [facility]PARTMENT AT [facility] Provider Note   CSN: [id number] Arrival date & time: 01/09/23  9563     History  Chief Complaint  Patient presents with   Chest Pain    Audrey Rodriguez is a 52 y.o. female, history of GERD, diabetes, who presents to the ED secondary to sharp stabbing pain, that woke her up out of her sleep, last night.  She states this sharp stabbing pain, woke her up, around 3 AM this morning, and is centered right below her chest, or upper abdomen.  States is associated with nausea and vomiting.  She has vomited about 5 times.  She had a hot dog last night, but nothing out of the usual.  Denies any shortness of breath, cough, fever, chills.  Last bowel movement was yesterday.  She states she feels a little bit better, now, after vomiting about 5 times, and the pain is now only a 7 out of 10.  Has not taken anything for the pain.  Is also requesting to be checked for urinary tract infection, as she states her urine looked a little bit dark.  Denies any increased frequency, urgency.  Home Medications Prior to Admission medications   Medication Sig Start Date End Date Taking? Authorizing Provider  ACCU-CHEK GUIDE test strip TEST GLUCOSE EVERY DAY 04/08/21   [provider]  aspirin 325 MG tablet Take 325 mg by mouth daily.    [provider]  cyanocobalamin (,VITAMIN B-12,) 1000 MCG/ML injection Inject 1,000 mcg into the muscle every 30 (thirty) days. 04/08/21   [provider]  famotidine (PEPCID) 20 MG tablet Take 1 tablet (20 mg total) by mouth 2 (two) times daily. 01/09/23   Dalon Reichart L, PA  meclizine (ANTIVERT) 25 MG tablet Take 1 tablet (25 mg total) by mouth 3 (three) times daily as needed for dizziness. Patient not taking: Reported on 04/30/2021 01/21/21   Molpus, John, MD  metFORMIN (GLUCOPHAGE) 500 MG tablet Take 500 mg by mouth daily. 02/04/21   [provider]  simvastatin (ZOCOR) 40 MG tablet Take 20 mg by  mouth at bedtime.    [provider]  tiZANidine (ZANAFLEX) 4 MG tablet Take 1 tablet (4 mg total) by mouth every 6 (six) hours as needed for muscle spasms. Patient not taking: Reported on 04/30/2021 01/24/20   Wieters, Fran Lowes C, PA-C  Vitamin D, Ergocalciferol, (DRISDOL) 1.25 MG (50000 UNIT) CAPS capsule Take 50,000 Units by mouth every 7 (seven) days. on Sunday 11/25/20   [provider]      Allergies    Patient has no known allergies.    Review of Systems   Review of Systems  Cardiovascular:  Positive for chest pain.  Gastrointestinal:  Positive for nausea and vomiting. Negative for diarrhea.    Physical Exam Updated Vital Signs BP (!) 142/89   Pulse 78   Temp 98.2 F (36.8 C) (Oral)   Resp 15   SpO2 100%  Physical Exam Vitals and nursing note reviewed.  Constitutional:      General: She is not in acute distress.    Appearance: She is well-developed.  HENT:     Head: Normocephalic and atraumatic.  Eyes:     Conjunctiva/sclera: Conjunctivae normal.  Cardiovascular:     Rate and Rhythm: Normal rate and regular rhythm.     Heart sounds: No murmur heard. Pulmonary:     Effort: Pulmonary effort is normal. No respiratory distress.     Breath sounds: Normal  breath sounds.  Abdominal:     Palpations: Abdomen is soft.     Tenderness: There is abdominal tenderness in the epigastric area and left upper quadrant. There is no guarding or rebound.  Musculoskeletal:        General: No swelling.     Cervical back: Neck supple.  Skin:    General: Skin is warm and dry.     Capillary Refill: Capillary refill takes less than 2 seconds.  Neurological:     Mental Status: She is alert.  Psychiatric:        Mood and Affect: Mood normal.     ED Results / Procedures / Treatments   Labs (all labs ordered are listed, but only abnormal results are displayed) Labs Reviewed  BASIC METABOLIC PANEL - Abnormal; Notable for the following components:      Result Value    Glucose, Bld 150 (*)    Creatinine, Ser 1.03 (*)    All other components within normal limits  CBC - Abnormal; Notable for the following components:   Hemoglobin 11.7 (*)    All other components within normal limits  URINALYSIS, ROUTINE W REFLEX MICROSCOPIC - Abnormal; Notable for the following components:   Hgb urine dipstick MODERATE (*)    Ketones, ur 5 (*)    All other components within normal limits  HCG, SERUM, QUALITATIVE  HEPATIC FUNCTION PANEL  LIPASE, BLOOD  TROPONIN I (HIGH SENSITIVITY)  TROPONIN I (HIGH SENSITIVITY)    EKG EKG Interpretation Date/Time:  Saturday January 09 2023 05:49:49 EDT Ventricular Rate:  82 PR Interval:  134 QRS Duration:  84 QT Interval:  370 QTC Calculation: 432 R Axis:   -3  Text Interpretation: Normal sinus rhythm Normal ECG When compared with ECG of 20-Jan-2021 21:20, PREVIOUS ECG IS PRESENT Confirmed by Kennis Carina [phone]) on 01/09/2023 6:20:36 AM  Radiology DG Chest 2 View  Result Date: 01/09/2023 CLINICAL DATA:  Chest pain. EXAM: CHEST - 2 VIEW COMPARISON:  PA Lat 01/20/2021 FINDINGS: The heart size and mediastinal contours are within normal limits. Both lungs are clear. The visualized skeletal structures are intact, with thoracic spondylosis. IMPRESSION: No active cardiopulmonary disease.  Stable chest. Electronically Signed   By: Almira Bar M.D.   On: 01/09/2023 06:19    Procedures Procedures    Medications Ordered in ED Medications  ondansetron (ZOFRAN) injection 4 mg (4 mg Intravenous Not Given 01/09/23 0718)  sodium chloride 0.9 % bolus 1,000 mL (0 mLs Intravenous Stopped 01/09/23 0843)  ketorolac (TORADOL) 15 MG/ML injection 15 mg (15 mg Intravenous Given 01/09/23 0715)  alum & mag hydroxide-simeth (MAALOX/MYLANTA) 200-200-20 MG/5ML suspension 30 mL (30 mLs Oral Given 01/09/23 0717)    And  lidocaine (XYLOCAINE) 2 % viscous mouth solution 15 mL (15 mLs Oral Given 01/09/23 0717)  pantoprazole (PROTONIX) injection 40 mg (40  mg Intravenous Given 01/09/23 0843)    ED Course/ Medical Decision Making/ A&P                                 Medical Decision Making Patient is a 52 year old female, here with epigastric pain, nausea, vomiting, that began around 3 AM today.  She vomited several times and feels little bit better.  Pain is localized to the epigastric region, sharp and stabbing.  Has improved.  Has not take anything for the pain.  Does have a history of GERD.  Ate a hot dog last night.  We will obtain a troponin, lipase, chest x-ray, EKG for further evaluation.  She is also requesting that we check her urine to make sure she does not have a urinary tract infection.  She denies any kind of increased frequency or dysuria,  Amount and/or Complexity of Data Reviewed Labs: ordered.    Details: Troponin x 2 within normal limits, no electrolyte deficiencies, lipase within normal limits Radiology: ordered.    Details: Chest x-ray clear ECG/medicine tests:  Decision-making details documented in ED Course. Discussion of management or test interpretation with external provider(s): Discussed with patient, overall reassuring workup.  Troponins x 2 within normal limits.  Hu pain appears to be epigastric with associated nausea vomiting I think it is more related to her GERD, and that possibly eating a hot dog last night was invoking factor.  She is feeling much better after the GI cocktail and pantoprazole which further supports my suspicion that is likely acid reflux induced.  She is overall well-appearing, no evidence of urinary tract infection, that which we did per her request.  Will have her follow-up with her primary care doctor, and start the famotidine as prescribed.  Return precautions emphasized. HEART: 4  Risk OTC drugs. Prescription drug management.    Final Clinical Impression(s) / ED Diagnoses Final diagnoses:  Epigastric pain  Nausea and vomiting, unspecified vomiting type    Rx / DC Orders ED Discharge  Orders          Ordered    famotidine (PEPCID) 20 MG tablet  2 times daily,   Status:  Discontinued        01/09/23 0948    famotidine (PEPCID) 20 MG tablet  2 times daily        01/09/23 0949              Levar Fayson, Harley Alto, PA 01/09/23 0953    Joylene Wescott, Harley Alto, PA 01/09/23 2952    Derwood Kaplan, MD 01/10/23 [phone]

## 2023-01-09 NOTE — Discharge Instructions (Addendum)
Please follow-up with your primary care doctor, I believe that your epigastric pain or nausea and vomiting is likely secondary to acid reflux.  Your heart enzymes were normal, as well as your electrolytes.  Return to the ER if you feel like your symptoms are worsening.  Make sure you drink lots of fluids, and rest.  Use the choices above, to help with your acid root reflux, with your food choices.  I have started you on a medicine to help with your acid reflux

## 2023-01-29 ENCOUNTER — Ambulatory Visit: Payer: Medicaid Other

## 2023-02-04 ENCOUNTER — Ambulatory Visit
Admission: RE | Admit: 2023-02-04 | Discharge: 2023-02-04 | Disposition: A | Discharge status: Home / Self Care | Payer: Medicaid Other | Source: Ambulatory Visit | Attending: Internal Medicine | Admitting: Internal Medicine

## 2023-02-04 DIAGNOSIS — Z0000 Encounter for general adult medical examination without abnormal findings: Secondary | ICD-10-CM

## 2023-06-13 ENCOUNTER — Emergency Department (HOSPITAL_COMMUNITY)

## 2023-06-13 ENCOUNTER — Observation Stay (HOSPITAL_COMMUNITY)

## 2023-06-13 ENCOUNTER — Other Ambulatory Visit: Payer: Self-pay

## 2023-06-13 ENCOUNTER — Encounter (HOSPITAL_COMMUNITY): Payer: Self-pay | Admitting: Emergency Medicine

## 2023-06-13 ENCOUNTER — Observation Stay (HOSPITAL_COMMUNITY)
Admission: EM | Admit: 2023-06-13 | Discharge: 2023-06-14 | Disposition: A | Discharge status: Home / Self Care | Attending: Internal Medicine | Admitting: Internal Medicine

## 2023-06-13 DIAGNOSIS — R0789 Other chest pain: Principal | ICD-10-CM | POA: Insufficient documentation

## 2023-06-13 DIAGNOSIS — E785 Hyperlipidemia, unspecified: Secondary | ICD-10-CM | POA: Diagnosis not present

## 2023-06-13 DIAGNOSIS — Z7982 Long term (current) use of aspirin: Secondary | ICD-10-CM | POA: Diagnosis not present

## 2023-06-13 DIAGNOSIS — Z7984 Long term (current) use of oral hypoglycemic drugs: Secondary | ICD-10-CM | POA: Insufficient documentation

## 2023-06-13 DIAGNOSIS — K219 Gastro-esophageal reflux disease without esophagitis: Secondary | ICD-10-CM | POA: Insufficient documentation

## 2023-06-13 DIAGNOSIS — R9431 Abnormal electrocardiogram [ECG] [EKG]: Secondary | ICD-10-CM | POA: Insufficient documentation

## 2023-06-13 DIAGNOSIS — I10 Essential (primary) hypertension: Secondary | ICD-10-CM | POA: Insufficient documentation

## 2023-06-13 DIAGNOSIS — Z87891 Personal history of nicotine dependence: Secondary | ICD-10-CM | POA: Diagnosis not present

## 2023-06-13 DIAGNOSIS — E119 Type 2 diabetes mellitus without complications: Secondary | ICD-10-CM | POA: Diagnosis not present

## 2023-06-13 DIAGNOSIS — R112 Nausea with vomiting, unspecified: Secondary | ICD-10-CM | POA: Insufficient documentation

## 2023-06-13 DIAGNOSIS — Z79899 Other long term (current) drug therapy: Secondary | ICD-10-CM | POA: Diagnosis not present

## 2023-06-13 DIAGNOSIS — R079 Chest pain, unspecified: Secondary | ICD-10-CM | POA: Diagnosis not present

## 2023-06-13 LAB — LIPID PANEL
Cholesterol: 228 mg/dL — ABNORMAL HIGH (ref 0–200)
HDL: 53 mg/dL (ref >40)
LDL Cholesterol: 157 mg/dL — ABNORMAL HIGH (ref 0–99)
Total CHOL/HDL Ratio: 4.3 ratio
Triglycerides: 92 mg/dL (ref <150)
VLDL: 18 mg/dL (ref 0–40)

## 2023-06-13 LAB — CBC
HCT: 38 % (ref 36.0–46.0)
Hemoglobin: 12.2 g/dL (ref 12.0–15.0)
MCH: 26.9 pg (ref 26.0–34.0)
MCHC: 32.1 g/dL (ref 30.0–36.0)
MCV: 83.7 fL (ref 80.0–100.0)
Platelets: 292 10*3/uL (ref 150–400)
RBC: 4.54 MIL/uL (ref 3.87–5.11)
RDW: 13.3 % (ref 11.5–15.5)
WBC: 8 10*3/uL (ref 4.0–10.5)
nRBC: 0 % (ref 0.0–0.2)

## 2023-06-13 LAB — HEPATIC FUNCTION PANEL
ALT: 65 U/L — ABNORMAL HIGH (ref 0–44)
AST: 87 U/L — ABNORMAL HIGH (ref 15–41)
Albumin: 3.7 g/dL (ref 3.5–5.0)
Alkaline Phosphatase: 72 U/L (ref 38–126)
Bilirubin, Direct: <0.1 mg/dL (ref 0.0–0.2)
Total Bilirubin: 0.6 mg/dL (ref 0.0–1.2)
Total Protein: 6.8 g/dL (ref 6.5–8.1)

## 2023-06-13 LAB — HIV ANTIBODY (ROUTINE TESTING W REFLEX): HIV Screen 4th Generation wRfx: NONREACTIVE

## 2023-06-13 LAB — BASIC METABOLIC PANEL
Anion gap: 11 (ref 5–15)
BUN: 15 mg/dL (ref 6–20)
CO2: 22 mmol/L (ref 22–32)
Calcium: 9.3 mg/dL (ref 8.9–10.3)
Chloride: 106 mmol/L (ref 98–111)
Creatinine, Ser: 0.71 mg/dL (ref 0.44–1.00)
GFR, Estimated: >60 mL/min (ref >60)
Glucose, Bld: 140 mg/dL — ABNORMAL HIGH (ref 70–99)
Potassium: 3.5 mmol/L (ref 3.5–5.1)
Sodium: 139 mmol/L (ref 135–145)

## 2023-06-13 LAB — TROPONIN I (HIGH SENSITIVITY)
Troponin I (High Sensitivity): 3 ng/L (ref <18)
Troponin I (High Sensitivity): 3 ng/L (ref <18)

## 2023-06-13 LAB — RAPID URINE DRUG SCREEN, HOSP PERFORMED
Amphetamines: NOT DETECTED
Barbiturates: NOT DETECTED
Benzodiazepines: NOT DETECTED
Cocaine: NOT DETECTED
Opiates: NOT DETECTED
Tetrahydrocannabinol: NOT DETECTED

## 2023-06-13 LAB — LIPASE, BLOOD: Lipase: 26 U/L (ref 11–51)

## 2023-06-13 LAB — HEMOGLOBIN A1C
Hgb A1c MFr Bld: 6.1 % — ABNORMAL HIGH (ref 4.8–5.6)
Mean Plasma Glucose: 128.37 mg/dL

## 2023-06-13 LAB — HCG, SERUM, QUALITATIVE: Preg, Serum: NEGATIVE

## 2023-06-13 LAB — CBG MONITORING, ED
Glucose-Capillary: 186 mg/dL — ABNORMAL HIGH (ref 70–99)
Glucose-Capillary: 55 mg/dL — ABNORMAL LOW (ref 70–99)
Glucose-Capillary: 82 mg/dL (ref 70–99)

## 2023-06-13 LAB — TSH: TSH: 1.279 u[IU]/mL (ref 0.350–4.500)

## 2023-06-13 MED ORDER — ONDANSETRON HCL 4 MG/2ML IJ SOLN
4.0000 mg | Freq: Once | INTRAMUSCULAR | Status: AC
  Administered 2023-06-13: 4 mg via INTRAVENOUS
  Filled 2023-06-13: qty 2

## 2023-06-13 MED ORDER — PANTOPRAZOLE SODIUM 40 MG IV SOLR
40.0000 mg | INTRAVENOUS | Status: DC
  Administered 2023-06-13 – 2023-06-14 (×2): 40 mg via INTRAVENOUS
  Filled 2023-06-13 (×2): qty 10

## 2023-06-13 MED ORDER — MORPHINE SULFATE (PF) 4 MG/ML IV SOLN
4.0000 mg | Freq: Once | INTRAVENOUS | Status: DC
  Filled 2023-06-13: qty 1

## 2023-06-13 MED ORDER — ONDANSETRON HCL 4 MG/2ML IJ SOLN
4.0000 mg | Freq: Four times a day (QID) | INTRAMUSCULAR | Status: DC | PRN
Start: 2023-06-13 — End: 2023-06-14

## 2023-06-13 MED ORDER — LOSARTAN POTASSIUM 50 MG PO TABS
25.0000 mg | ORAL_TABLET | Freq: Every day | ORAL | Status: DC
  Administered 2023-06-13: 25 mg via ORAL
  Filled 2023-06-13: qty 1

## 2023-06-13 MED ORDER — NITROGLYCERIN 0.4 MG SL SUBL: 0.4000 mg | SUBLINGUAL_TABLET | SUBLINGUAL | Status: DC | PRN

## 2023-06-13 MED ORDER — ACETAMINOPHEN 325 MG PO TABS
650.0000 mg | ORAL_TABLET | Freq: Once | ORAL | Status: AC
  Administered 2023-06-13: 650 mg via ORAL
  Filled 2023-06-13: qty 2

## 2023-06-13 MED ORDER — ACETAMINOPHEN 325 MG PO TABS
650.0000 mg | ORAL_TABLET | ORAL | Status: DC | PRN
  Administered 2023-06-13 – 2023-06-14 (×2): 650 mg via ORAL
  Filled 2023-06-13 (×2): qty 2

## 2023-06-13 MED ORDER — DEXTROSE 50 % IV SOLN
1.0000 | Freq: Once | INTRAVENOUS | Status: AC
  Administered 2023-06-13: 50 mL via INTRAVENOUS
  Filled 2023-06-13: qty 50

## 2023-06-13 MED ORDER — LORATADINE 10 MG PO TABS
10.0000 mg | ORAL_TABLET | Freq: Every day | ORAL | Status: DC
  Administered 2023-06-13: 10 mg via ORAL
  Filled 2023-06-13: qty 1

## 2023-06-13 MED ORDER — ASPIRIN 81 MG PO TBEC
81.0000 mg | DELAYED_RELEASE_TABLET | Freq: Every day | ORAL | Status: DC
  Administered 2023-06-13: 81 mg via ORAL
  Filled 2023-06-13: qty 1

## 2023-06-13 MED ORDER — SIMVASTATIN 20 MG PO TABS
20.0000 mg | ORAL_TABLET | Freq: Every day | ORAL | Status: DC
  Administered 2023-06-13: 20 mg via ORAL
  Filled 2023-06-13: qty 1

## 2023-06-13 MED ORDER — FLUTICASONE PROPIONATE 50 MCG/ACT NA SUSP: 2.0000 | Freq: Every day | NASAL | Status: DC | PRN

## 2023-06-13 MED ORDER — MORPHINE SULFATE (PF) 2 MG/ML IV SOLN: 1.0000 mg | INTRAVENOUS | Status: AC | PRN

## 2023-06-13 MED ORDER — IOHEXOL 350 MG/ML SOLN
75.0000 mL | Freq: Once | INTRAVENOUS | Status: AC | PRN
  Administered 2023-06-13: 75 mL via INTRAVENOUS

## 2023-06-13 MED ORDER — INSULIN ASPART 100 UNIT/ML IJ SOLN: 0.0000 [IU] | INTRAMUSCULAR | Status: DC

## 2023-06-13 MED ORDER — HEPARIN SODIUM (PORCINE) 5000 UNIT/ML IJ SOLN
5000.0000 [IU] | Freq: Three times a day (TID) | INTRAMUSCULAR | Status: DC
  Administered 2023-06-13 – 2023-06-14 (×3): 5000 [IU] via SUBCUTANEOUS
  Filled 2023-06-13 (×4): qty 1

## 2023-06-13 NOTE — H&P (Signed)
 History and Physical    Patient: Audrey Rodriguez:[id number] DOB: Jul 29, 1970 DOA: 06/13/2023 DOS: the patient was seen and examined on 06/13/2023 PCP: Fleet Contras, MD  Patient coming from: Home Chief complaint: Chief Complaint  Patient presents with   Chest Pain   HPI:  Audrey Rodriguez is a 53 y.o. female with past medical history  of diabetes mellitus type 2, GERD, anxiety, anemia, essential hypertension presenting with chest pain and patient points to upper abdomen lower chest as the chest pain states that it is going to the right side of her back all all throughout her back and points mostly to her right upper quadrant on exam patient has right upper quadrant tenderness.  Patient otherwise is a limited historian.  ED Course: Alert awake oriented afebrile O2 sats 99% on room air. Vitals:   06/13/23 1122 06/13/23 1145 06/13/23 1537 06/13/23 2024  BP: (!) 152/92   132/72  Pulse: 63   76  Temp: 97.8 F (36.6 C)  97.6 F (36.4 C)   Resp: 16 16  14   Height:      Weight:      SpO2: 100%   99%  TempSrc: Oral  Oral   BMI (Calculated):      Admission requested for chest pain evaluation, cardiology consulted per EDMD. In the ED patient had a CTA chest abdomen and pelvis showing no acute abdominal pelvic process, and normal fibroid uterus, no acute thoracic vascular abnormality, no thoracoabdominal aneurysmal dilation dilatation or dissection CT also negative for focal liver abnormality gallstones gallbladder thickening biliary dilatation, with additional chronic findings please refer to complete report. CMP shows glucose of 140 normal kidney function. Abnormal LFTs with mild elevation of AST at 87 and ALT at 65. Troponins flat at 3. Lipid panel showing total cholesterol 228 LDL 157 HDL 53 triglyceride 92.  EKG showing sinus rhythm 91 PR 124 T wave inversions in lead III, and QTc of 447 EKG shows sinus rhythm 65 PR of 156 prior T wave inversion on initial EKG currently results QTc  430.  Review of Systems  Cardiovascular:  Positive for chest pain.  Gastrointestinal:  Positive for abdominal pain.   Past Medical History:  Diagnosis Date   Anemia    Ankle swelling    for 5 years   Anxiety    Arthritis    swelling in left ankle at times    GERD (gastroesophageal reflux disease)    no meds   Heart murmur    Hemorrhoids    since child birth, 25 years ago   Hypercholesteremia    Past Surgical History:  Procedure Laterality Date   CESAREAN SECTION     x 5    reports that she quit smoking about 25 years ago. Her smoking use included cigarettes. She has never used smokeless tobacco. She reports that she does not drink alcohol and does not use drugs.  No Known Allergies  Family History  Problem Relation Age of Onset   Heart disease Mother    Diabetes Father    Kidney disease Father    Colon cancer Neg Hx    Colon polyps Neg Hx    Stomach cancer Neg Hx    Esophageal cancer Neg Hx    Rectal cancer Neg Hx    Breast cancer Neg Hx     Prior to Admission medications   Medication Sig Start Date End Date Taking? Authorizing Provider  ACCU-CHEK GUIDE test strip TEST GLUCOSE EVERY DAY 04/08/21   [provider]  aspirin 325 MG tablet Take 325 mg by mouth daily.    [provider]  cyanocobalamin (,VITAMIN B-12,) 1000 MCG/ML injection Inject 1,000 mcg into the muscle every 30 (thirty) days. 04/08/21   [provider]  famotidine (PEPCID) 20 MG tablet Take 1 tablet (20 mg total) by mouth 2 (two) times daily. 01/09/23   Small, Brooke L, PA  meclizine (ANTIVERT) 25 MG tablet Take 1 tablet (25 mg total) by mouth 3 (three) times daily as needed for dizziness. Patient not taking: Reported on 04/30/2021 01/21/21   Molpus, John, MD  metFORMIN (GLUCOPHAGE) 500 MG tablet Take 500 mg by mouth daily. 02/04/21   [provider]  simvastatin (ZOCOR) 40 MG tablet Take 20 mg by mouth at bedtime.    [provider]  tiZANidine (ZANAFLEX) 4 MG  tablet Take 1 tablet (4 mg total) by mouth every 6 (six) hours as needed for muscle spasms. Patient not taking: Reported on 04/30/2021 01/24/20   Wieters, Fran Lowes C, PA-C  Vitamin D, Ergocalciferol, (DRISDOL) 1.25 MG (50000 UNIT) CAPS capsule Take 50,000 Units by mouth every 7 (seven) days. on Sunday 11/25/20   [provider]                                                                                   Vitals:   06/13/23 1122 06/13/23 1145 06/13/23 1537 06/13/23 2024  BP: (!) 152/92   132/72  Pulse: 63   76  Resp: 16 16  14   Temp: 97.8 F (36.6 C)  97.6 F (36.4 C)   TempSrc: Oral  Oral   SpO2: 100%   99%  Weight:      Height:       Physical Exam Vitals and nursing note reviewed.  Constitutional:      General: She is not in acute distress. HENT:     Head: Normocephalic and atraumatic.     Right Ear: Hearing normal.     Left Ear: Hearing normal.     Nose: Nose normal. No nasal deformity.     Mouth/Throat:     Lips: Pink.     Tongue: No lesions.     Pharynx: Oropharynx is clear.  Eyes:     General: Lids are normal.     Extraocular Movements: Extraocular movements intact.  Cardiovascular:     Rate and Rhythm: Normal rate and regular rhythm.     Heart sounds: Normal heart sounds.  Pulmonary:     Effort: Pulmonary effort is normal.     Breath sounds: Normal breath sounds.  Abdominal:     General: Bowel sounds are normal. There is no distension.     Palpations: Abdomen is soft. There is no mass.     Tenderness: There is no abdominal tenderness.  Musculoskeletal:     Right lower leg: No edema.     Left lower leg: No edema.  Skin:    General: Skin is warm.  Neurological:     General: No focal deficit present.     Mental Status: She is alert and oriented to person, place, and time.     Cranial Nerves: Cranial nerves 2-12 are intact.  Psychiatric:        Attention and Perception: Attention normal.        Mood and Affect: Mood normal.        Speech: Speech  normal.        Behavior: Behavior normal. Behavior is cooperative.      Labs on Admission: I have personally reviewed following labs and imaging studies  CBC: Recent Labs  Lab 06/13/23 0436  WBC 8.0  HGB 12.2  HCT 38.0  MCV 83.7  PLT 292   Basic Metabolic Panel: Recent Labs  Lab 06/13/23 0436  NA 139  K 3.5  CL 106  CO2 22  GLUCOSE 140*  BUN 15  CREATININE 0.71  CALCIUM 9.3   GFR: Estimated Creatinine Clearance: 71.2 mL/min (by C-G formula based on SCr of 0.71 mg/dL). Liver Function Tests: Recent Labs  Lab 06/13/23 0659  AST 87*  ALT 65*  ALKPHOS 72  BILITOT 0.6  PROT 6.8  ALBUMIN 3.7   Recent Labs  Lab 06/13/23 0659  LIPASE 26   No results for input(s): "AMMONIA" in the last 168 hours. Coagulation Profile: No results for input(s): "INR", "PROTIME" in the last 168 hours. Cardiac Enzymes: No results for input(s): "CKTOTAL", "CKMB", "CKMBINDEX", "TROPONINI" in the last 168 hours. BNP (last 3 results) No results for input(s): "PROBNP" in the last 8760 hours. HbA1C: Recent Labs    06/13/23 0659  HGBA1C 6.1*   CBG: Recent Labs  Lab 06/13/23 1535 06/13/23 1559 06/13/23 1951  GLUCAP 55* 186* 82   Lipid Profile: Recent Labs    06/13/23 0659  CHOL 228*  HDL 53  LDLCALC 157*  TRIG 92  CHOLHDL 4.3   Thyroid Function Tests: Recent Labs    06/13/23 0659  TSH 1.279   Anemia Panel: No results for input(s): "VITAMINB12", "FOLATE", "FERRITIN", "TIBC", "IRON", "RETICCTPCT" in the last 72 hours. Urine analysis:    Component Value Date/Time   COLORURINE YELLOW 01/09/2023 0845   APPEARANCEUR CLEAR 01/09/2023 0845   LABSPEC 1.017 01/09/2023 0845   PHURINE 6.0 01/09/2023 0845   GLUCOSEU NEGATIVE 01/09/2023 0845   HGBUR MODERATE (A) 01/09/2023 0845   BILIRUBINUR NEGATIVE 01/09/2023 0845   KETONESUR 5 (A) 01/09/2023 0845   PROTEINUR NEGATIVE 01/09/2023 0845   UROBILINOGEN 0.2 01/24/2020 1802   NITRITE NEGATIVE 01/09/2023 0845   LEUKOCYTESUR  NEGATIVE 01/09/2023 0845    Radiological Exams on Admission: US Abdomen Limited RUQ (LIVER/GB) Result Date: 06/13/2023 CLINICAL DATA:  Right upper quadrant abdominal pain. EXAM: ULTRASOUND ABDOMEN LIMITED RIGHT UPPER QUADRANT COMPARISON:  None Available. FINDINGS: Gallbladder: No gallstones or wall thickening visualized. No sonographic Murphy sign noted by sonographer. Common bile duct: Diameter: 6 mm which is within normal limits. Liver: No focal lesion identified. Within normal limits in parenchymal echogenicity. Portal vein is patent on color Doppler imaging with normal direction of blood flow towards the liver. Other: None. IMPRESSION: No definite abnormality seen in the right upper quadrant of the abdomen. Electronically Signed   By: Lupita Raider M.D.   On: 06/13/2023 14:19   CT Angio Chest/Abd/Pel for Dissection W and/or Wo Contrast Result Date: 06/13/2023 CLINICAL DATA:  Acute aortic syndrome (AAS) suspected Chief complaints Chest Pain EXAM: CT ANGIOGRAPHY CHEST, ABDOMEN AND PELVIS TECHNIQUE: Non-contrast CT of the chest was initially obtained. Multidetector CT imaging through the chest, abdomen and pelvis was performed using the standard protocol during bolus administration of intravenous contrast. Multiplanar reconstructed images and MIPs were obtained and reviewed to evaluate the vascular anatomy.  RADIATION DOSE REDUCTION: This exam was performed according to the departmental dose-optimization program which includes automated exposure control, adjustment of the mA and/or kV according to patient size and/or use of iterative reconstruction technique. CONTRAST:  75mL OMNIPAQUE IOHEXOL 350 MG/ML SOLN COMPARISON:  CHEST XR, 06/13/2023.  CT AP, 05/02/2013. FINDINGS: CTA CHEST FINDINGS Cardiovascular: Preferential opacification of the thoracic aorta. No evidence of thoracic aortic aneurysm or dissection. No segmental or larger pulmonary embolus. Normal heart size. No pericardial effusion.  Mediastinum/Nodes: No enlarged mediastinal, hilar, or axillary lymph nodes. 1.2 cm RIGHT thyroid nodule. No follow-up is indicated. Trachea, and esophagus demonstrate no significant findings. Lungs/Pleura: Solitary sub-6 mm RIGHT lower lobe solid pulmonary nodule. Small subpleural pulmonary cysts bilaterally. Lungs are otherwise clear without focal consolidation, or mass. No pleural effusion or pneumothorax. Musculoskeletal: No acute chest wall abnormality. Review of the MIP images confirms the above findings. CTA ABDOMEN AND PELVIS FINDINGS VASCULAR Aorta: Normal caliber of the thoracoabdominal aorta without aneurysm, dissection, vasculitis or significant stenosis. Celiac: Patent without evidence of aneurysm, dissection, vasculitis or significant stenosis. SMA: Patent without evidence of aneurysm, dissection, vasculitis or significant stenosis. Incidental variant mesenteric anatomy, with aortic origin of the LEFT colic artery. Renals: Single renal arteries are present. Both renal arteries are patent without evidence of aneurysm, dissection, vasculitis, fibromuscular dysplasia or significant stenosis. IMA: Patent without evidence of aneurysm, dissection, vasculitis or significant stenosis. Inflow: Patent without evidence of aneurysm, dissection, vasculitis or significant stenosis. Veins: No obvious venous abnormality within the limitations of this arterial phase study. Review of the MIP images confirms the above findings. NON-VASCULAR Hepatobiliary: No focal liver abnormality is seen. No gallstones, gallbladder wall thickening, or biliary dilatation. Pancreas: No pancreatic ductal dilatation or surrounding inflammatory changes. Spleen: Normal in size without focal abnormality. Adrenals/Urinary Tract: Adrenal glands are unremarkable. Kidneys are normal, without renal calculi, focal lesion, or hydronephrosis. Bladder is unremarkable. Stomach/Bowel: Stomach is within normal limits. Appendix is not definitively  visualized. No evidence of bowel wall thickening, distention, or inflammatory changes. Lymphatic: No enlarged abdominal or pelvic lymph nodes. 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 Adnexa are unremarkable. Other: No abdominal wall hernia or abnormality. No abdominopelvic ascites. Musculoskeletal: No acute osseous findings. Review of the MIP images confirms the above findings. IMPRESSION: 1. No acute thoracic vascular abnormality. Specifically, no thoracoabdominal aneurysmal dilatation or dissection. 2.  No segmental or larger pulmonary embolus. 3. No acute abdominopelvic process. 4. Enlarged, fibroid uterus. Additional incidental, chronic and senescent findings as above. Electronically Signed   By: Roanna Banning M.D.   On: 06/13/2023 08:21   DG Chest 2 View Result Date: 06/13/2023 CLINICAL DATA:  Chest pain EXAM: CHEST - 2 VIEW COMPARISON:  01/09/2023 FINDINGS: The lungs are clear without focal pneumonia, edema, pneumothorax or pleural effusion. The cardiopericardial silhouette is within normal limits for size. No acute bony abnormality. IMPRESSION: No acute cardiopulmonary findings. Electronically Signed   By: Kennith Center M.D.   On: 06/13/2023 06:38   Data Reviewed: Relevant notes from primary care and specialist visits, past discharge summaries as available in EHR, including Care Everywhere. Prior diagnostic testing as pertinent to current admission diagnoses, Updated medications and problem lists for reconciliation ED course, including vitals, labs, imaging, treatment and response to treatment,Triage notes, nursing and pharmacy notes and ED provider's notes Notable results as noted in HPI.Discussed case with EDMD/ ED APP/ or Specialty MD on call and as needed.  Active Problems: >> Chest pain: Greatly appreciate cardiology consult.  Will  admit to telemetry unit with continuous cardiac monitoring moderate to high risk factors for coronary  heart disease. Patient currently on aspirin, echocardiogram per cardiology.  N.p.o. after midnight. UDS pending.   >>Essential HTN:  Vitals:   06/13/23 0428 06/13/23 1122 06/13/23 2024  BP: (!) 180/88 (!) 152/92 132/72  Will continue with home regimen of losartan 25 mg.  >> Right upper quadrant abdominal pain: Mild elevation in LFTs, CT angio negative for any hepatobiliary abnormality.  Will follow.   right upper quadrant ultrasound.   >> Diabetes mellitus type 2: Patient is currently managed on Ozempic, metformin held. Glycemic protocol and carb consistent diet.  N.p.o. after midnight.   DVT prophylaxis:  Heparin  Consults:  Cardiology  Advance Care Planning:    Code Status: Full Code   Family Communication:  Home  Disposition Plan:  Home  Severity of Illness: The appropriate patient status for this patient is OBSERVATION. Observation status is judged to be reasonable and necessary in order to provide the required intensity of service to ensure the patient's safety. The patient's presenting symptoms, physical exam findings, and initial radiographic and laboratory data in the context of their medical condition is felt to place them at decreased risk for further clinical deterioration. Furthermore, it is anticipated that the patient will be medically stable for discharge from the hospital within 2 midnights of admission.   Author: Gertha Calkin, MD 06/13/2023 8:58 PM  For on call review [email or web address].   Unresulted Labs (From admission, onward)     Start     Ordered   06/13/23 1357  Rapid urine drug screen (hospital performed)  ONCE - STAT,   STAT        06/13/23 1357   06/13/23 1319  HIV Antibody (routine testing w rflx)  (HIV Antibody (Routine testing w reflex) panel)  Once,   R        06/13/23 1321            Orders Placed This Encounter  Procedures   DG Chest 2 View   CT Angio Chest/Abd/Pel for Dissection W and/or Wo Contrast   US Abdomen Limited RUQ (LIVER/GB)    Basic metabolic panel   CBC   hCG, serum, qualitative   Hepatic function panel   Lipase, blood   HIV Antibody (routine testing w rflx)   Hemoglobin A1c   TSH   Rapid urine drug screen (hospital performed)   Lipid panel   Diet Carb Modified Fluid consistency: Thin; Room service appropriate? Yes   Diet NPO time specified   Diet NPO time specified   Document Height and Actual Weight   Refer to Sidebar Report Refer to ICU, Med-Surg, Progressive, and Step-Down Mobility Protocol Sidebars   Apply Angina, Rule Out Myocardial Infarction Care Plan   Vital signs with O2 sat q4 hours x 24 hours, then q shift   Cardiac Monitoring Continuous x 24 hours Indications for use: Other; other indications for use: chest pain   RN may order Cardiology PRN Orders utilizing "Cardiology PRN medications" (through manage orders) for the following patient needs:   Mobility Protocol: No Restrictions RN to initiate protocols based on patient's level of care   Apply Diabetes Mellitus Care Plan   STAT CBG when hypoglycemia is suspected. If treated, recheck every 15 minutes after each treatment until CBG >/= 70 mg/dl   Refer to Hypoglycemia Protocol Sidebar Report for treatment of CBG < 70 mg/dl   Full code   Inpatient consult to Cardiology  Consult to hospitalist   Inpatient consult to Cardiology Consult Timeframe: ROUTINE - requires response within 24 hours; Reason for Consult? chest pain   CBG monitoring, ED   CBG monitoring, ED   CBG monitoring, ED   EKG 12-Lead   ED EKG   Repeat EKG   EKG 12-Lead (at 6am)   ECHOCARDIOGRAM COMPLETE   Place in observation (patient's expected length of stay will be less than 2 midnights)

## 2023-06-13 NOTE — ED Provider Notes (Signed)
 [place] [facility] AT [facility] Provider Note   CSN: [id number] Arrival date & time: 06/13/23  0422     History  Chief Complaint  Patient presents with   Chest Pain    Audrey Rodriguez is a 53 y.o. female.  Patient is a 53 year old female with a past medical history of hypertension, diabetes and hyperlipidemia presenting to the emergency department with chest pain.  The patient states that she woke up this morning with a sharp cramping pain across her chest and upper abdomen that radiated to her mid back.  She states that the pain lasted for about 30 minutes.  She states she had associated nausea and vomiting.  She denied any shortness of breath.  She states she had 1 episode of diarrhea.  Denies any fever, dysuria or hematuria.  The history is provided by the patient.  Chest Pain      Home Medications Prior to Admission medications   Medication Sig Start Date End Date Taking? Authorizing Provider  ACCU-CHEK GUIDE test strip TEST GLUCOSE EVERY DAY 04/08/21   [provider]  aspirin 325 MG tablet Take 325 mg by mouth daily.    [provider]  cyanocobalamin (,VITAMIN B-12,) 1000 MCG/ML injection Inject 1,000 mcg into the muscle every 30 (thirty) days. 04/08/21   [provider]  famotidine (PEPCID) 20 MG tablet Take 1 tablet (20 mg total) by mouth 2 (two) times daily. 01/09/23   Small, Brooke L, PA  meclizine (ANTIVERT) 25 MG tablet Take 1 tablet (25 mg total) by mouth 3 (three) times daily as needed for dizziness. Patient not taking: Reported on 04/30/2021 01/21/21   Molpus, John, MD  metFORMIN (GLUCOPHAGE) 500 MG tablet Take 500 mg by mouth daily. 02/04/21   [provider]  simvastatin (ZOCOR) 40 MG tablet Take 20 mg by mouth at bedtime.    [provider]  tiZANidine (ZANAFLEX) 4 MG tablet Take 1 tablet (4 mg total) by mouth every 6 (six) hours as needed for muscle spasms. Patient not taking: Reported on 04/30/2021  01/24/20   Wieters, Fran Lowes C, PA-C  Vitamin D, Ergocalciferol, (DRISDOL) 1.25 MG (50000 UNIT) CAPS capsule Take 50,000 Units by mouth every 7 (seven) days. on Sunday 11/25/20   [provider]      Allergies    Patient has no known allergies.    Review of Systems   Review of Systems  Cardiovascular:  Positive for chest pain.    Physical Exam Updated Vital Signs BP (!) 152/92 (BP Location: Right Arm)   Pulse 63   Temp 97.8 F (36.6 C) (Oral)   Resp 16   Ht 5\' 1"  (1.549 m)   Wt 70.3 kg   SpO2 100%   BMI 29.29 kg/m  Physical Exam Vitals and nursing note reviewed.  Constitutional:      General: She is not in acute distress.    Appearance: She is well-developed.  HENT:     Head: Normocephalic and atraumatic.  Eyes:     Extraocular Movements: Extraocular movements intact.  Cardiovascular:     Rate and Rhythm: Normal rate and regular rhythm.     Pulses:          Radial pulses are 2+ on the right side and 2+ on the left side.     Heart sounds: Normal heart sounds.  Pulmonary:     Effort: Pulmonary effort is normal.     Breath sounds: Normal breath sounds.  Abdominal:  Palpations: Abdomen is soft.     Tenderness: There is no abdominal tenderness.  Musculoskeletal:        General: Normal range of motion.     Cervical back: Normal range of motion and neck supple.     Right lower leg: No edema.     Left lower leg: No edema.  Skin:    General: Skin is warm and dry.  Neurological:     General: No focal deficit present.     Mental Status: She is alert and oriented to person, place, and time.  Psychiatric:        Mood and Affect: Mood normal.        Behavior: Behavior normal.     ED Results / Procedures / Treatments   Labs (all labs ordered are listed, but only abnormal results are displayed) Labs Reviewed  BASIC METABOLIC PANEL - Abnormal; Notable for the following components:      Result Value   Glucose, Bld 140 (*)    All other components within normal  limits  HEPATIC FUNCTION PANEL - Abnormal; Notable for the following components:   AST 87 (*)    ALT 65 (*)    All other components within normal limits  CBC  HCG, SERUM, QUALITATIVE  LIPASE, BLOOD  TROPONIN I (HIGH SENSITIVITY)  TROPONIN I (HIGH SENSITIVITY)    EKG EKG Interpretation Date/Time:  Sunday June 13 2023 11:39:01 EDT Ventricular Rate:  65 PR Interval:  156 QRS Duration:  80 QT Interval:  414 QTC Calculation: 430 R Axis:   10  Text Interpretation: Normal sinus rhythm Normal ECG  Prior t-wave inversions now resolved compared to last EKG Confirmed by Elayne Snare ([id number]) on 06/13/2023 11:40:57 AM  Radiology CT Angio Chest/Abd/Pel for Dissection W and/or Wo Contrast Result Date: 06/13/2023 CLINICAL DATA:  Acute aortic syndrome (AAS) suspected Chief complaints Chest Pain EXAM: CT ANGIOGRAPHY CHEST, ABDOMEN AND PELVIS TECHNIQUE: Non-contrast CT of the chest was initially obtained. Multidetector CT imaging through the chest, abdomen and pelvis was performed using the standard protocol during bolus administration of intravenous contrast. Multiplanar reconstructed images and MIPs were obtained and reviewed to evaluate the vascular anatomy. RADIATION DOSE REDUCTION: This exam was performed according to the departmental dose-optimization program which includes automated exposure control, adjustment of the mA and/or kV according to patient size and/or use of iterative reconstruction technique. CONTRAST:  75mL OMNIPAQUE IOHEXOL 350 MG/ML SOLN COMPARISON:  CHEST XR, 06/13/2023.  CT AP, 05/02/2013. FINDINGS: CTA CHEST FINDINGS Cardiovascular: Preferential opacification of the thoracic aorta. No evidence of thoracic aortic aneurysm or dissection. No segmental or larger pulmonary embolus. Normal heart size. No pericardial effusion. Mediastinum/Nodes: No enlarged mediastinal, hilar, or axillary lymph nodes. 1.2 cm RIGHT thyroid nodule. No follow-up is indicated. Trachea, and esophagus  demonstrate no significant findings. Lungs/Pleura: Solitary sub-6 mm RIGHT lower lobe solid pulmonary nodule. Small subpleural pulmonary cysts bilaterally. Lungs are otherwise clear without focal consolidation, or mass. No pleural effusion or pneumothorax. Musculoskeletal: No acute chest wall abnormality. Review of the MIP images confirms the above findings. CTA ABDOMEN AND PELVIS FINDINGS VASCULAR Aorta: Normal caliber of the thoracoabdominal aorta without aneurysm, dissection, vasculitis or significant stenosis. Celiac: Patent without evidence of aneurysm, dissection, vasculitis or significant stenosis. SMA: Patent without evidence of aneurysm, dissection, vasculitis or significant stenosis. Incidental variant mesenteric anatomy, with aortic origin of the LEFT colic artery. Renals: Single renal arteries are present. Both renal arteries are patent without evidence of aneurysm, dissection, vasculitis, fibromuscular dysplasia or significant  stenosis. IMA: Patent without evidence of aneurysm, dissection, vasculitis or significant stenosis. Inflow: Patent without evidence of aneurysm, dissection, vasculitis or significant stenosis. Veins: No obvious venous abnormality within the limitations of this arterial phase study. Review of the MIP images confirms the above findings. NON-VASCULAR Hepatobiliary: No focal liver abnormality is seen. No gallstones, gallbladder wall thickening, or biliary dilatation. Pancreas: No pancreatic ductal dilatation or surrounding inflammatory changes. Spleen: Normal in size without focal abnormality. Adrenals/Urinary Tract: Adrenal glands are unremarkable. Kidneys are normal, without renal calculi, focal lesion, or hydronephrosis. Bladder is unremarkable. Stomach/Bowel: Stomach is within normal limits. Appendix is not definitively visualized. No evidence of bowel wall thickening, distention, or inflammatory changes. Lymphatic: No enlarged abdominal or pelvic lymph nodes. Reproductive:  Enlarged myomatous uterus, incidentally with predominant LEFT ovarian arterial feeder. Adnexa are unremarkable. Other: No abdominal wall hernia or abnormality. No abdominopelvic ascites. Musculoskeletal: No acute osseous findings. Review of the MIP images confirms the above findings. IMPRESSION: 1. No acute thoracic vascular abnormality. Specifically, no thoracoabdominal aneurysmal dilatation or dissection. 2.  No segmental or larger pulmonary embolus. 3. No acute abdominopelvic process. 4. Enlarged, fibroid uterus. Additional incidental, chronic and senescent findings as above. Electronically Signed   By: Roanna Banning M.D.   On: 06/13/2023 08:21   DG Chest 2 View Result Date: 06/13/2023 CLINICAL DATA:  Chest pain EXAM: CHEST - 2 VIEW COMPARISON:  01/09/2023 FINDINGS: The lungs are clear without focal pneumonia, edema, pneumothorax or pleural effusion. The cardiopericardial silhouette is within normal limits for size. No acute bony abnormality. IMPRESSION: No acute cardiopulmonary findings. Electronically Signed   By: Kennith Center M.D.   On: 06/13/2023 06:38    Procedures Procedures    Medications Ordered in ED Medications  morphine (PF) 4 MG/ML injection 4 mg (0 mg Intravenous Hold 06/13/23 0510)  ondansetron (ZOFRAN) injection 4 mg (4 mg Intravenous Given 06/13/23 0510)  acetaminophen (TYLENOL) tablet 650 mg (650 mg Oral Given 06/13/23 0630)  iohexol (OMNIPAQUE) 350 MG/ML injection 75 mL (75 mLs Intravenous Contrast Given 06/13/23 0717)    ED Course/ Medical Decision Making/ A&P Clinical Course as of 06/13/23 1311  Sun Jun 13, 2023  1141 EKG appears to have had dynamic changes when having pain vs pain free. Will plan to discuss with cardiology. [VK]  1158 I spoke with Dr. Wyline Mood with cardiology who recommended hospitalist admission and they will see in consult. [VK]    Clinical Course User Index [VK] Rexford Maus, DO                                 Medical Decision Making This patient  presents to the ED with chief complaint(s) of chest pain, abdominal pain with pertinent past medical history of hypertension, hyperlipidemia, diabetes which further complicates the presenting complaint. The complaint involves an extensive differential diagnosis and also carries with it a high risk of complications and morbidity.    The differential diagnosis includes ACS, arrhythmia, anemia, pneumonia, pneumothorax, pulmonary edema, pleural effusion, gastritis, GERD, pancreatitis, hepatitis, considering dissection with radiation of pain in abdomen and back  Additional history obtained: Additional history obtained from N/A Records reviewed N/A  ED Course and Reassessment: On patient's arrival she was hypertensive and otherwise hemodynamically stable in no acute distress.  Was initially evaluated by provider in triage and had EKG, labs and CT dissection study.  EKG showed inferior T wave inversions compared to prior EKG.  She has had  negative troponin x 2.  CT dissection study showed no acute abnormality.  The patient is currently chest pain-free.  Patient will have LFTs and lipase added on and repeat EKG and will be closely reassessed.  Independent labs interpretation:  The following labs were independently interpreted: within normal range  Independent visualization of imaging: - I independently visualized the following imaging with scope of interpretation limited to determining acute life threatening conditions related to emergency care: CXR, CT dissection, which revealed no acute abnormality  Consultation: - Consulted or discussed management/test interpretation w/ external professional: cardiology, hospitalist  Consideration for admission or further workup: patient requires admission for high risk chest pain Social Determinants of health: N/A    Amount and/or Complexity of Data Reviewed Labs: ordered. Radiology: ordered.  Risk OTC drugs. Decision regarding  hospitalization.          Final Clinical Impression(s) / ED Diagnoses Final diagnoses:  Nonspecific chest pain  T wave inversion in EKG    Rx / DC Orders ED Discharge Orders     None         Rexford Maus, DO 06/13/23 1311

## 2023-06-13 NOTE — Consult Note (Signed)
 Cardiology Consultation   Patient ID: Gari Trovato MRN: [id number]; DOB: Dec 24, 1970  Admit date: 06/13/2023 Date of Consult: 06/13/2023  PCP:  Fleet Contras, MD   [facility] Providers Cardiologist:  New}     Patient Profile:   Alishba Naples is a 53 y.o. female with a hx of HTN, HLD, DM2 who is being seen 06/13/2023 for the evaluation of chest pain at the request of Dr Allena Katz.  History of Present Illness:   Ms. Stanwood 53 yo female history of DM2, HLD, HTN, reports family history of MI in her mon in her 30s, presents with chest pain.  Pain woke her from sleep. Pressure like pain across entire chest wrapping around into back. Some associated nausea, vomiting. 10/10 in severity, lasted about 30 minutes. Mild positional component. Some associated SOB.    K 3.5 BUN 15 Cr 0.71 WBC 8 Hgb 12.2  Trop 3-->3 EKG SR, dynamic TWIs inferior leads CXR no acute process CTA C/A/P: no acute process   Past Medical History:  Diagnosis Date   Anemia    Ankle swelling    for 5 years   Anxiety    Arthritis    swelling in left ankle at times    GERD (gastroesophageal reflux disease)    no meds   Heart murmur    Hemorrhoids    since child birth, 25 years ago   Hypercholesteremia     Past Surgical History:  Procedure Laterality Date   CESAREAN SECTION     x 5      Inpatient Medications: Scheduled Meds:  heparin  5,000 Units Subcutaneous Q8H   insulin aspart  0-6 Units Subcutaneous Q4H   morphine (PF)  4 mg Intravenous Once   pantoprazole (PROTONIX) IV  40 mg Intravenous Q24H   simvastatin  20 mg Oral q1800   Continuous Infusions:  PRN Meds: acetaminophen, morphine injection, nitroGLYCERIN, ondansetron (ZOFRAN) IV  Allergies:   No Known Allergies  Social History:   Social History   Socioeconomic History   Marital status: Legally Separated    Spouse name: Not on file   Number of children: Not on file   Years of education: Not on file   Highest education level: Not  on file  Occupational History   Not on file  Tobacco Use   Smoking status: Former    Current packs/day: 0.00    Types: Cigarettes    Quit date: 12/28/1997    Years since quitting: 25.4   Smokeless tobacco: Never  Vaping Use   Vaping status: Never Used  Substance and Sexual Activity   Alcohol use: No   Drug use: No   Sexual activity: Not Currently    Comment: BTL  Other Topics Concern   Not on file  Social History Narrative   Not on file   Social Drivers of Health   Financial Resource Strain: Not on file  Food Insecurity: Not on file  Transportation Needs: Not on file  Physical Activity: Not on file  Stress: Not on file  Social Connections: Not on file  Intimate Partner Violence: Not on file    Family History:    Family History  Problem Relation Age of Onset   Heart disease Mother    Diabetes Father    Kidney disease Father    Colon cancer Neg Hx    Colon polyps Neg Hx    Stomach cancer Neg Hx    Esophageal cancer Neg Hx    Rectal cancer Neg Hx  Breast cancer Neg Hx      ROS:  Please see the history of present illness.   All other ROS reviewed and negative.     Physical Exam/Data:   Vitals:   06/13/23 0428 06/13/23 0431 06/13/23 1122  BP: (!) 180/88  (!) 152/92  Pulse: 89  63  Resp: 20  16  Temp: 98 F (36.7 C)  97.8 F (36.6 C)  TempSrc:   Oral  SpO2: 97%  100%  Weight:  70.3 kg   Height:  5\' 1"  (1.549 m)    No intake or output data in the 24 hours ending 06/13/23 1327    06/13/2023    4:31 AM 09/19/2022   11:49 AM 04/30/2021    1:47 PM  Last 3 Weights  Weight (lbs) 155 lb 165 lb 169 lb 3.2 oz  Weight (kg) 70.308 kg 74.844 kg 76.749 kg     Body mass index is 29.29 kg/m.  General:  Well nourished, well developed, in no acute distress HEENT: normal Neck: no JVD Vascular: No carotid bruits; Distal pulses 2+ bilaterally Cardiac:  normal S1, S2; RRR; no murmur  Lungs:  clear to auscultation bilaterally, no wheezing, rhonchi or rales  Abd:  soft, nontender, no hepatomegaly  Ext: no edema Musculoskeletal:  No deformities, BUE and BLE strength normal and equal Skin: warm and dry  Neuro:  CNs 2-12 intact, no focal abnormalities noted Psych:  Normal affect     Laboratory Data:  High Sensitivity Troponin:   Recent Labs  Lab 06/13/23 0436 06/13/23 0659  TROPONINIHS 3 3     Chemistry Recent Labs  Lab 06/13/23 0436  NA 139  K 3.5  CL 106  CO2 22  GLUCOSE 140*  BUN 15  CREATININE 0.71  CALCIUM 9.3  GFRNONAA >60  ANIONGAP 11    Recent Labs  Lab 06/13/23 0659  PROT 6.8  ALBUMIN 3.7  AST 87*  ALT 65*  ALKPHOS 72  BILITOT 0.6   Lipids No results for input(s): "CHOL", "TRIG", "HDL", "LABVLDL", "LDLCALC", "CHOLHDL" in the last 168 hours.  Hematology Recent Labs  Lab 06/13/23 0436  WBC 8.0  RBC 4.54  HGB 12.2  HCT 38.0  MCV 83.7  MCH 26.9  MCHC 32.1  RDW 13.3  PLT 292   Thyroid No results for input(s): "TSH", "FREET4" in the last 168 hours.  BNPNo results for input(s): "BNP", "PROBNP" in the last 168 hours.  DDimer No results for input(s): "DDIMER" in the last 168 hours.   Radiology/Studies:  CT Angio Chest/Abd/Pel for Dissection W and/or Wo Contrast Result Date: 06/13/2023 CLINICAL DATA:  Acute aortic syndrome (AAS) suspected Chief complaints Chest Pain EXAM: CT ANGIOGRAPHY CHEST, ABDOMEN AND PELVIS TECHNIQUE: Non-contrast CT of the chest was initially obtained. Multidetector CT imaging through the chest, abdomen and pelvis was performed using the standard protocol during bolus administration of intravenous contrast. Multiplanar reconstructed images and MIPs were obtained and reviewed to evaluate the vascular anatomy. RADIATION DOSE REDUCTION: This exam was performed according to the departmental dose-optimization program which includes automated exposure control, adjustment of the mA and/or kV according to patient size and/or use of iterative reconstruction technique. CONTRAST:  75mL OMNIPAQUE IOHEXOL  350 MG/ML SOLN COMPARISON:  CHEST XR, 06/13/2023.  CT AP, 05/02/2013. FINDINGS: CTA CHEST FINDINGS Cardiovascular: Preferential opacification of the thoracic aorta. No evidence of thoracic aortic aneurysm or dissection. No segmental or larger pulmonary embolus. Normal heart size. No pericardial effusion. Mediastinum/Nodes: No enlarged mediastinal, hilar, or axillary lymph  nodes. 1.2 cm RIGHT thyroid nodule. No follow-up is indicated. Trachea, and esophagus demonstrate no significant findings. Lungs/Pleura: Solitary sub-6 mm RIGHT lower lobe solid pulmonary nodule. Small subpleural pulmonary cysts bilaterally. Lungs are otherwise clear without focal consolidation, or mass. No pleural effusion or pneumothorax. Musculoskeletal: No acute chest wall abnormality. Review of the MIP images confirms the above findings. CTA ABDOMEN AND PELVIS FINDINGS VASCULAR Aorta: Normal caliber of the thoracoabdominal aorta without aneurysm, dissection, vasculitis or significant stenosis. Celiac: Patent without evidence of aneurysm, dissection, vasculitis or significant stenosis. SMA: Patent without evidence of aneurysm, dissection, vasculitis or significant stenosis. Incidental variant mesenteric anatomy, with aortic origin of the LEFT colic artery. Renals: Single renal arteries are present. Both renal arteries are patent without evidence of aneurysm, dissection, vasculitis, fibromuscular dysplasia or significant stenosis. IMA: Patent without evidence of aneurysm, dissection, vasculitis or significant stenosis. Inflow: Patent without evidence of aneurysm, dissection, vasculitis or significant stenosis. Veins: No obvious venous abnormality within the limitations of this arterial phase study. Review of the MIP images confirms the above findings. NON-VASCULAR Hepatobiliary: No focal liver abnormality is seen. No gallstones, gallbladder wall thickening, or biliary dilatation. Pancreas: No pancreatic ductal dilatation or surrounding  inflammatory changes. Spleen: Normal in size without focal abnormality. Adrenals/Urinary Tract: Adrenal glands are unremarkable. Kidneys are normal, without renal calculi, focal lesion, or hydronephrosis. Bladder is unremarkable. Stomach/Bowel: Stomach is within normal limits. Appendix is not definitively visualized. No evidence of bowel wall thickening, distention, or inflammatory changes. Lymphatic: No enlarged abdominal or pelvic lymph nodes. 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 Adnexa are unremarkable. Other: No abdominal wall hernia or abnormality. No abdominopelvic ascites. Musculoskeletal: No acute osseous findings. Review of the MIP images confirms the above findings. IMPRESSION: 1. No acute thoracic vascular abnormality. Specifically, no thoracoabdominal aneurysmal dilatation or dissection. 2.  No segmental or larger pulmonary embolus. 3. No acute abdominopelvic process. 4. Enlarged, fibroid uterus. Additional incidental, chronic and senescent findings as above. Electronically Signed   By: Roanna Banning M.D.   On: 06/13/2023 08:21   DG Chest 2 View Result Date: 06/13/2023 CLINICAL DATA:  Chest pain EXAM: CHEST - 2 VIEW COMPARISON:  01/09/2023 FINDINGS: The lungs are clear without focal pneumonia, edema, pneumothorax or pleural effusion. The cardiopericardial silhouette is within normal limits for size. No acute bony abnormality. IMPRESSION: No acute cardiopulmonary findings. Electronically Signed   By: Kennith Center M.D.   On: 06/13/2023 06:38     Assessment and Plan:   1.Chest pain - somewhat atypical symptoms. Wrapped around entire chest into back, some positional component.  - dynamic inferior Twave changes noted on serial EKGs in ER - trops negative thus far - risk factors with HDL, DM2, HTN, mother with reported MI in her 30s.  -CTA was benign for aortic pathology. No atherosclerosis reported  - f/u echo. Given risk factors  and EKG changes I would think would warrant further ischemic testing, consider coronary CTA tomorrow pending echo findings. Make npo at midnight.  - if recurrent chest pains could start anticoag at that time. For now continue ASA, home regimen.     For questions or updates, please contact [facility] Please consult [email or web address] for contact info under    Signed, Dina Rich, MD  06/13/2023 1:27 PM

## 2023-06-13 NOTE — ED Triage Notes (Addendum)
 Patient states she woke up to generalized chest pain that radiates to back and abdomen. Described as sharp. Reports nausea + vomiting.

## 2023-06-13 NOTE — ED Provider Triage Note (Signed)
 Emergency Medicine Provider Triage Evaluation Note  Cassady Stanczak , a 53 y.o. female  was evaluated in triage.  Pt complains of chest pain, back pain, abdominal pain.  Awakened her from sleep.  Denies fever or chills.  Denies hx of heart problems.  Review of Systems  Positive: Chest pain, back pain, abdominal pain Negative: fever  Physical Exam  BP (!) 180/88 (BP Location: Right Arm)   Pulse 89   Temp 98 F (36.7 C)   Resp 20   Ht 5\' 1"  (1.549 m)   Wt 70.3 kg   SpO2 97%   BMI 29.29 kg/m  Gen:   Awake, appears uncomfortable Resp:  Normal effort  MSK:   Moves extremities without difficulty  Other:    Medical Decision Making  Medically screening exam initiated at 4:51 AM.  Appropriate orders placed.  Tabby Beaston was informed that the remainder of the evaluation will be completed by another provider, this initial triage assessment does not replace that evaluation, and the importance of remaining in the ED until their evaluation is complete.  Chest pain, back pain, abdominal pain and hypertensive.  Will check labs.  Looks quite uncomfortable.  Dissection considered.  Will get CT.   Roxy Horseman, PA-C 06/13/23 [phone]

## 2023-06-14 ENCOUNTER — Observation Stay (HOSPITAL_COMMUNITY)

## 2023-06-14 ENCOUNTER — Observation Stay (HOSPITAL_BASED_OUTPATIENT_CLINIC_OR_DEPARTMENT_OTHER)

## 2023-06-14 DIAGNOSIS — R0789 Other chest pain: Secondary | ICD-10-CM

## 2023-06-14 DIAGNOSIS — R9431 Abnormal electrocardiogram [ECG] [EKG]: Secondary | ICD-10-CM | POA: Diagnosis not present

## 2023-06-14 DIAGNOSIS — R079 Chest pain, unspecified: Secondary | ICD-10-CM | POA: Diagnosis not present

## 2023-06-14 LAB — ECHOCARDIOGRAM COMPLETE
AR max vel: 2.22 cm2
AV Peak grad: 11.2 mmHg
Ao pk vel: 1.67 m/s
Area-P 1/2: 3.31 cm2
Height: 61 in
MV VTI: 3 cm2
S' Lateral: 2.5 cm
Weight: 2480 [oz_av]

## 2023-06-14 LAB — CBG MONITORING, ED
Glucose-Capillary: 106 mg/dL — ABNORMAL HIGH (ref 70–99)
Glucose-Capillary: 120 mg/dL — ABNORMAL HIGH (ref 70–99)
Glucose-Capillary: 77 mg/dL (ref 70–99)
Glucose-Capillary: 97 mg/dL (ref 70–99)

## 2023-06-14 MED ORDER — NITROGLYCERIN 0.4 MG SL SUBL
SUBLINGUAL_TABLET | SUBLINGUAL | Status: AC
  Filled 2023-06-14: qty 1

## 2023-06-14 MED ORDER — METOPROLOL TARTRATE 5 MG/5ML IV SOLN
INTRAVENOUS | Status: AC
  Filled 2023-06-14: qty 10

## 2023-06-14 MED ORDER — METOPROLOL TARTRATE 5 MG/5ML IV SOLN
5.0000 mg | Freq: Once | INTRAVENOUS | Status: AC
  Administered 2023-06-14: 5 mg via INTRAVENOUS

## 2023-06-14 MED ORDER — NITROGLYCERIN 0.4 MG SL SUBL
0.8000 mg | SUBLINGUAL_TABLET | Freq: Once | SUBLINGUAL | Status: AC
  Administered 2023-06-14: 0.8 mg via SUBLINGUAL

## 2023-06-14 MED ORDER — DILTIAZEM HCL 25 MG/5ML IV SOLN: 10.0000 mg | INTRAVENOUS | Status: DC | PRN

## 2023-06-14 MED ORDER — ASPIRIN 81 MG PO TBEC
81.0000 mg | DELAYED_RELEASE_TABLET | Freq: Every day | ORAL | Status: DC
  Administered 2023-06-14: 81 mg via ORAL
  Filled 2023-06-14: qty 1

## 2023-06-14 MED ORDER — METOPROLOL TARTRATE 5 MG/5ML IV SOLN
INTRAVENOUS | Status: AC
  Filled 2023-06-14: qty 5

## 2023-06-14 MED ORDER — IOHEXOL 350 MG/ML SOLN
95.0000 mL | Freq: Once | INTRAVENOUS | Status: AC | PRN
  Administered 2023-06-14: 95 mL via INTRAVENOUS

## 2023-06-14 MED ORDER — METOPROLOL TARTRATE 5 MG/5ML IV SOLN
10.0000 mg | INTRAVENOUS | Status: AC | PRN
  Administered 2023-06-14: 2.5 mg via INTRAVENOUS
  Administered 2023-06-14: 5 mg via INTRAVENOUS

## 2023-06-14 MED ORDER — PERFLUTREN LIPID MICROSPHERE
1.0000 mL | INTRAVENOUS | Status: AC | PRN
Start: 2023-06-14 — End: 2023-06-14
  Administered 2023-06-14: 2 mL via INTRAVENOUS

## 2023-06-14 NOTE — Hospital Course (Addendum)
 53 year old female with T2DM GERD anxiety HTN, arthritis presents with chest pain with nausea and vomiting. Patient was seen in the ED underwent initial evaluation EKG NSR previous T wave inversion resolved, concern for dynamic changes, cardiology consulted felt to be somewhat atypical, with troponin negative, and CT negative for aortic pathology admitted for further workup with echocardiogram possible coronary CTA.-Echocardiogram unremarkable, CT coronary with normal coronary anatomy-discussed with cardiology and okay for discharge home  Consultation: Cardiology  Significant imaging/procedure: CT angio chest abdomen pelvis> no acute thoracic vascular finding or aneurysm no PE, enlarged fibroid uterus noted. Ultrasound right upper quadrant> no significant acute finding. PPI

## 2023-06-14 NOTE — ED Notes (Signed)
 Patient discharged by this RN. Patient verbalizes understanding of instructions with no additional questions. Ambulatory at time of discharge.

## 2023-06-14 NOTE — Discharge Summary (Signed)
 Physician Discharge Summary  Yania Bogie WUJ:[id number] DOB: 05/09/1970 DOA: 06/13/2023  PCP: Fleet Contras, MD  Admit date: 06/13/2023 Discharge date: 06/14/2023 Recommendations for Outpatient Follow-up:  Follow up with PCP in 1 weeks-call for appointment Please obtain BMP/CBC in one week  Discharge Dispo: Home Discharge Condition: Stable Code Status:   Code Status: Full Code Diet recommendation:  Diet Order             Diet Heart Room service appropriate? Yes; Fluid consistency: Thin  Diet effective now                    Brief/Interim Summary: 53 year old female with T2DM GERD anxiety HTN, arthritis presents with chest pain with nausea and vomiting. Patient was seen in the ED underwent initial evaluation EKG NSR previous T wave inversion resolved, concern for dynamic changes, cardiology consulted felt to be somewhat atypical, with troponin negative, and CT negative for aortic pathology admitted for further workup with echocardiogram possible coronary CTA.-Echocardiogram unremarkable, CT coronary with normal coronary anatomy-discussed with cardiology and okay for discharge home  Consultation: Cardiology  Significant imaging/procedure: CT angio chest abdomen pelvis> no acute thoracic vascular finding or aneurysm no PE, enlarged fibroid uterus noted. Ultrasound right upper quadrant> no significant acute finding. PPI    Discharge Diagnoses:  Principal Problem:   Chest pain   Chest pain: Likely atypical but dynamic changes on EKG respiratory hypertension diabetes hyperlipidemia and mother with MI in her 30s, follow-up echocardiogram Echocardiogram and CT coronaries are reassuring.  No further workup as per cardiology and okay for discharge home  Hypertension: BP controlled.  PTA on losartan, continue same.  HLD: On simvastatin, continue  T2DM: PTA on metoprolol, Ozempic.  For now continue sliding scale  GERD: Continue   Subjective: Alert awake oriented resting  comfortably no more chest pain  Discharge Exam: Vitals:   06/14/23 1045 06/14/23 1248  BP: 135/84   Pulse: 71   Resp: 17   Temp:  98 F (36.7 C)  SpO2: 100%    General: Pt is alert, awake, not in acute distress Cardiovascular: RRR, S1/S2 +, no rubs, no gallops Respiratory: CTA bilaterally, no wheezing, no rhonchi Abdominal: Soft, NT, ND, bowel sounds + Extremities: no edema, no cyanosis  Discharge Instructions  Discharge Instructions     Discharge instructions   Complete by: As directed    Please call call MD or return to ER for similar or worsening recurring problem that brought you to hospital or if any fever,nausea/vomiting,abdominal pain, uncontrolled pain, chest pain,  shortness of breath or any other alarming symptoms.  Please follow-up your doctor as instructed in a week time and call the office for appointment.  Please avoid alcohol, smoking, or any other illicit substance and maintain healthy habits including taking your regular medications as prescribed.  You were cared for by a hospitalist during your hospital stay. If you have any questions about your discharge medications or the care you received while you were in the hospital after you are discharged, you can call the unit and ask to speak with the hospitalist on call if the hospitalist that took care of you is not available.  Once you are discharged, your primary care physician will handle any further medical issues. Please note that NO REFILLS for any discharge medications will be authorized once you are discharged, as it is imperative that you return to your primary care physician (or establish a relationship with a primary care physician if you do not have one)  for your aftercare needs so that they can reassess your need for medications and monitor your lab values   Increase activity slowly   Complete by: As directed       Allergies as of 06/14/2023   No Known Allergies      Medication List     TAKE  these medications    Accu-Chek Guide test strip Generic drug: glucose blood TEST GLUCOSE EVERY DAY   aspirin EC 81 MG tablet Take 81 mg by mouth daily. Swallow whole.   cetirizine 10 MG tablet Commonly known as: ZYRTEC Take 10 mg by mouth daily as needed for allergies.   cyanocobalamin 1000 MCG/ML injection Commonly known as: VITAMIN B12 Inject 1,000 mcg into the muscle every 30 (thirty) days.   famotidine 20 MG tablet Commonly known as: PEPCID Take 1 tablet (20 mg total) by mouth 2 (two) times daily. What changed:  when to take this reasons to take this   fluticasone 50 MCG/ACT nasal spray Commonly known as: FLONASE Place 2 sprays into both nostrils daily as needed for allergies.   hydrOXYzine 25 MG tablet Commonly known as: ATARAX Take 25 mg by mouth at bedtime.   losartan 25 MG tablet Commonly known as: COZAAR Take 25 mg by mouth daily.   metFORMIN 500 MG tablet Commonly known as: GLUCOPHAGE Take 500 mg by mouth daily.   Ozempic (1 MG/DOSE) 4 MG/3ML Sopn Generic drug: Semaglutide (1 MG/DOSE) Inject 1 mg into the skin once a week.   simvastatin 40 MG tablet Commonly known as: ZOCOR Take 20 mg by mouth at bedtime.   Vitamin D (Ergocalciferol) 1.25 MG (50000 UNIT) Caps capsule Commonly known as: DRISDOL Take 50,000 Units by mouth every 7 (seven) days. on Sunday   VITAMIN K PO Take 1 tablet by mouth daily.        Follow-up Information     Fleet Contras, MD Follow up in 1 week(s).   Specialty: Internal Medicine Contact information: [address] [place] [address] [phone]                No Known Allergies  The results of significant diagnostics from this hospitalization (including imaging, microbiology, ancillary and laboratory) are listed below for reference.    Microbiology: No results found for this or any previous visit (from the past 240 hours).  Procedures/Studies: CT CORONARY MORPH W/CTA COR W/SCORE W/CA W/CM &/OR  WO/CM Result Date: 06/14/2023 CLINICAL DATA:  Chest pain EXAM: Cardiac/Coronary CTA TECHNIQUE: A non-contrast, gated CT scan was obtained with axial slices of 3 mm through the heart for calcium scoring. Calcium scoring was performed using the Agatston method. A 120 kV prospective, gated, contrast cardiac scan was obtained. Gantry rotation speed was 250 msecs and collimation was 0.6 mm. Two sublingual nitroglycerin tablets (0.8 mg) were given. The 3D data set was reconstructed in 5% intervals of the 35-75% of the R-R cycle. Diastolic phases were analyzed on a dedicated workstation using MPR, MIP, and VRT modes. The patient received 95 cc of contrast. FINDINGS: Image quality: Excellent. Noise artifact is: Limited. Coronary Arteries:  Normal coronary origin.  Right dominance. Left main: The left main is a large caliber vessel with a normal take off from the left coronary cusp that bifurcates to form a left anterior descending artery and a left circumflex artery. There is no plaque or stenosis. Left anterior descending artery: The LAD is patent without evidence of plaque or stenosis. The LAD gives off 3 patent diagonal branches. Left circumflex  artery: The LCX is non-dominant and patent with no evidence of plaque or stenosis. The LCX gives off 1 patent obtuse marginal branch. Right coronary artery: The RCA is dominant with normal take off from the right coronary cusp. There is no evidence of plaque or stenosis. The distal RCA appears normal, but there is considerable stair step artifact that cannot be cleared. The RCA terminates as a PDA and right posterolateral branch which appear patent. Right Atrium: Right atrial size is within normal limits. Right Ventricle: The right ventricular cavity is within normal limits. Left Atrium: Left atrial size is normal in size with no left atrial appendage filling defect. Small PFO. Left Ventricle: The ventricular cavity size is within normal limits. Pulmonary arteries: Normal in  size. Pulmonary veins: Normal pulmonary venous drainage. Pericardium: Normal thickness without significant effusion or calcium present. Cardiac valves: The aortic valve is trileaflet without significant calcification. The mitral valve is normal without significant calcification. Aorta: Normal caliber without significant disease. Extra-cardiac findings: See attached radiology report for non-cardiac structures. IMPRESSION: 1. Coronary calcium score of 0. 2. Normal coronary origin with right dominance. 3. Normal LAD, LCX, and proximal to mid RCA. 4. Non-diagnostic distal RCA due to slab artifact that cannot be cleared. 5. Small PFO. RECOMMENDATIONS: 1. CAD-RADS 0: No evidence of CAD (0%). Consider non-atherosclerotic causes of chest pain. Lennie Odor, MD Electronically Signed   By: Lennie Odor M.D.   On: 06/14/2023 12:20   ECHOCARDIOGRAM COMPLETE Result Date: 06/14/2023    ECHOCARDIOGRAM REPORT   Patient Name:   Jehieli Brassell Date of Exam: 06/14/2023 Medical Rec #:  [id number]   Height:       61.0 in Accession #:    [id number]  Weight:       155.0 lb Date of Birth:  [birth year]   BSA:          1.695 m Patient Age:    53 years    BP:           112/65 mmHg Patient Gender: F           HR:           76 bpm. Exam Location:  Inpatient Procedure: 2D Echo, Color Doppler, Cardiac Doppler and Intracardiac            Opacification Agent (Both Spectral and Color Flow Doppler were            utilized during procedure). Indications:    Chest Pain  History:        Patient has prior history of Echocardiogram examinations. Risk                 Factors:Diabetes.  Sonographer:    Lamont Snowball Referring Phys: Dorothe Pea BRANCH IMPRESSIONS  1. Left ventricular ejection fraction, by estimation, is 60 to 65%. The left ventricle has normal function. The left ventricle has no regional wall motion abnormalities. There is mild concentric left ventricular hypertrophy. Left ventricular diastolic parameters were normal.  2. Right ventricular  systolic function is normal. The right ventricular size is normal.  3. The mitral valve is normal in structure. Trivial mitral valve regurgitation. No evidence of mitral stenosis.  4. The aortic valve is normal in structure. Aortic valve regurgitation is not visualized. No aortic stenosis is present.  5. The inferior vena cava is normal in size with greater than 50% respiratory variability, suggesting right atrial pressure of 3 mmHg. FINDINGS  Left Ventricle: Left ventricular ejection fraction, by estimation, is 60 to  65%. The left ventricle has normal function. The left ventricle has no regional wall motion abnormalities. The left ventricular internal cavity size was normal in size. There is  mild concentric left ventricular hypertrophy. Left ventricular diastolic parameters were normal. Right Ventricle: The right ventricular size is normal. No increase in right ventricular wall thickness. Right ventricular systolic function is normal. Left Atrium: Left atrial size was normal in size. Right Atrium: Right atrial size was normal in size. Pericardium: There is no evidence of pericardial effusion. Mitral Valve: The mitral valve is normal in structure. Trivial mitral valve regurgitation. No evidence of mitral valve stenosis. MV peak gradient, 3.1 mmHg. The mean mitral valve gradient is 2.0 mmHg. Tricuspid Valve: The tricuspid valve is normal in structure. Tricuspid valve regurgitation is trivial. No evidence of tricuspid stenosis. Aortic Valve: The aortic valve is normal in structure. Aortic valve regurgitation is not visualized. No aortic stenosis is present. Aortic valve peak gradient measures 11.2 mmHg. Pulmonic Valve: The pulmonic valve was normal in structure. Pulmonic valve regurgitation is trivial. No evidence of pulmonic stenosis. Aorta: The aortic root is normal in size and structure. Venous: The inferior vena cava is normal in size with greater than 50% respiratory variability, suggesting right atrial pressure  of 3 mmHg. IAS/Shunts: No atrial level shunt detected by color flow Doppler.  LEFT VENTRICLE PLAX 2D LVIDd:         3.50 cm   Diastology LVIDs:         2.50 cm   LV e' medial:    9.25 cm/s LV PW:         1.30 cm   LV E/e' medial:  9.4 LV IVS:        1.30 cm   LV e' lateral:   11.20 cm/s LVOT diam:     2.20 cm   LV E/e' lateral: 7.8 LV SV:         71 LV SV Index:   42 LVOT Area:     3.80 cm  RIGHT VENTRICLE             IVC RV Basal diam:  3.50 cm     IVC diam: 1.20 cm RV S prime:     11.00 cm/s TAPSE (M-mode): 2.3 cm LEFT ATRIUM             Index        RIGHT ATRIUM           Index LA diam:        3.50 cm 2.07 cm/m   RA Area:     10.90 cm LA Vol (A2C):   41.8 ml 24.66 ml/m  RA Volume:   22.80 ml  13.45 ml/m LA Vol (A4C):   43.5 ml 25.67 ml/m LA Biplane Vol: 44.1 ml 26.02 ml/m  AORTIC VALVE AV Area (Vmax): 2.22 cm AV Vmax:        167.00 cm/s AV Peak Grad:   11.2 mmHg LVOT Vmax:      97.40 cm/s LVOT Vmean:     65.000 cm/s LVOT VTI:       0.188 m  AORTA Ao Root diam: 2.80 cm Ao Asc diam:  2.90 cm MITRAL VALVE MV Area (PHT): 3.31 cm    SHUNTS MV Area VTI:   3.00 cm    Systemic VTI:  0.19 m MV Peak grad:  3.1 mmHg    Systemic Diam: 2.20 cm MV Mean grad:  2.0 mmHg MV Vmax:  0.88 m/s MV Vmean:      60.0 cm/s MV Decel Time: 229 msec MV E velocity: 87.00 cm/s MV A velocity: 74.40 cm/s MV E/A ratio:  1.17 Arvilla Meres MD Electronically signed by Arvilla Meres MD Signature Date/Time: 06/14/2023/10:05:42 AM    Final    US Abdomen Limited RUQ (LIVER/GB) Result Date: 06/13/2023 CLINICAL DATA:  Right upper quadrant abdominal pain. EXAM: ULTRASOUND ABDOMEN LIMITED RIGHT UPPER QUADRANT COMPARISON:  None Available. FINDINGS: Gallbladder: No gallstones or wall thickening visualized. No sonographic Murphy sign noted by sonographer. Common bile duct: Diameter: 6 mm which is within normal limits. Liver: No focal lesion identified. Within normal limits in parenchymal echogenicity. Portal vein is patent on color  Doppler imaging with normal direction of blood flow towards the liver. Other: None. IMPRESSION: No definite abnormality seen in the right upper quadrant of the abdomen. Electronically Signed   By: Lupita Raider M.D.   On: 06/13/2023 14:19   CT Angio Chest/Abd/Pel for Dissection W and/or Wo Contrast Result Date: 06/13/2023 CLINICAL DATA:  Acute aortic syndrome (AAS) suspected Chief complaints Chest Pain EXAM: CT ANGIOGRAPHY CHEST, ABDOMEN AND PELVIS TECHNIQUE: Non-contrast CT of the chest was initially obtained. Multidetector CT imaging through the chest, abdomen and pelvis was performed using the standard protocol during bolus administration of intravenous contrast. Multiplanar reconstructed images and MIPs were obtained and reviewed to evaluate the vascular anatomy. RADIATION DOSE REDUCTION: This exam was performed according to the departmental dose-optimization program which includes automated exposure control, adjustment of the mA and/or kV according to patient size and/or use of iterative reconstruction technique. CONTRAST:  75mL OMNIPAQUE IOHEXOL 350 MG/ML SOLN COMPARISON:  CHEST XR, 06/13/2023.  CT AP, 05/02/2013. FINDINGS: CTA CHEST FINDINGS Cardiovascular: Preferential opacification of the thoracic aorta. No evidence of thoracic aortic aneurysm or dissection. No segmental or larger pulmonary embolus. Normal heart size. No pericardial effusion. Mediastinum/Nodes: No enlarged mediastinal, hilar, or axillary lymph nodes. 1.2 cm RIGHT thyroid nodule. No follow-up is indicated. Trachea, and esophagus demonstrate no significant findings. Lungs/Pleura: Solitary sub-6 mm RIGHT lower lobe solid pulmonary nodule. Small subpleural pulmonary cysts bilaterally. Lungs are otherwise clear without focal consolidation, or mass. No pleural effusion or pneumothorax. Musculoskeletal: No acute chest wall abnormality. Review of the MIP images confirms the above findings. CTA ABDOMEN AND PELVIS FINDINGS VASCULAR Aorta: Normal  caliber of the thoracoabdominal aorta without aneurysm, dissection, vasculitis or significant stenosis. Celiac: Patent without evidence of aneurysm, dissection, vasculitis or significant stenosis. SMA: Patent without evidence of aneurysm, dissection, vasculitis or significant stenosis. Incidental variant mesenteric anatomy, with aortic origin of the LEFT colic artery. Renals: Single renal arteries are present. Both renal arteries are patent without evidence of aneurysm, dissection, vasculitis, fibromuscular dysplasia or significant stenosis. IMA: Patent without evidence of aneurysm, dissection, vasculitis or significant stenosis. Inflow: Patent without evidence of aneurysm, dissection, vasculitis or significant stenosis. Veins: No obvious venous abnormality within the limitations of this arterial phase study. Review of the MIP images confirms the above findings. NON-VASCULAR Hepatobiliary: No focal liver abnormality is seen. No gallstones, gallbladder wall thickening, or biliary dilatation. Pancreas: No pancreatic ductal dilatation or surrounding inflammatory changes. Spleen: Normal in size without focal abnormality. Adrenals/Urinary Tract: Adrenal glands are unremarkable. Kidneys are normal, without renal calculi, focal lesion, or hydronephrosis. Bladder is unremarkable. Stomach/Bowel: Stomach is within normal limits. Appendix is not definitively visualized. No evidence of bowel wall thickening, distention, or inflammatory changes. Lymphatic: No enlarged abdominal or pelvic lymph nodes. Reproductive: Enlarged myomatous uterus, incidentally with predominant LEFT  ovarian arterial feeder. Adnexa are unremarkable. Other: No abdominal wall hernia or abnormality. No abdominopelvic ascites. Musculoskeletal: No acute osseous findings. Review of the MIP images confirms the above findings. IMPRESSION: 1. No acute thoracic vascular abnormality. Specifically, no thoracoabdominal aneurysmal dilatation or dissection. 2.  No  segmental or larger pulmonary embolus. 3. No acute abdominopelvic process. 4. Enlarged, fibroid uterus. Additional incidental, chronic and senescent findings as above. Electronically Signed   By: Roanna Banning M.D.   On: 06/13/2023 08:21   DG Chest 2 View Result Date: 06/13/2023 CLINICAL DATA:  Chest pain EXAM: CHEST - 2 VIEW COMPARISON:  01/09/2023 FINDINGS: The lungs are clear without focal pneumonia, edema, pneumothorax or pleural effusion. The cardiopericardial silhouette is within normal limits for size. No acute bony abnormality. IMPRESSION: No acute cardiopulmonary findings. Electronically Signed   By: Kennith Center M.D.   On: 06/13/2023 06:38    Labs: BNP (last 3 results) No results for input(s): "BNP" in the last 8760 hours. Basic Metabolic Panel: Recent Labs  Lab 06/13/23 0436  NA 139  K 3.5  CL 106  CO2 22  GLUCOSE 140*  BUN 15  CREATININE 0.71  CALCIUM 9.3   Liver Function Tests: Recent Labs  Lab 06/13/23 0659  AST 87*  ALT 65*  ALKPHOS 72  BILITOT 0.6  PROT 6.8  ALBUMIN 3.7   Recent Labs  Lab 06/13/23 0659  LIPASE 26   No results for input(s): "AMMONIA" in the last 168 hours. CBC: Recent Labs  Lab 06/13/23 0436  WBC 8.0  HGB 12.2  HCT 38.0  MCV 83.7  PLT 292   Cardiac Enzymes: No results for input(s): "CKTOTAL", "CKMB", "CKMBINDEX", "TROPONINI" in the last 168 hours. BNP: Invalid input(s): "POCBNP" CBG: Recent Labs  Lab 06/13/23 1951 06/14/23 0015 06/14/23 0356 06/14/23 0732 06/14/23 1136  GLUCAP 82 77 106* 120* 97   D-Dimer No results for input(s): "DDIMER" in the last 72 hours. Hgb A1c Recent Labs    06/13/23 0659  HGBA1C 6.1*   Lipid Profile Recent Labs    06/13/23 0659  CHOL 228*  HDL 53  LDLCALC 157*  TRIG 92  CHOLHDL 4.3   Thyroid function studies Recent Labs    06/13/23 0659  TSH 1.279   Anemia work up No results for input(s): "VITAMINB12", "FOLATE", "FERRITIN", "TIBC", "IRON", "RETICCTPCT" in the last 72  hours. Urinalysis    Component Value Date/Time   COLORURINE YELLOW 01/09/2023 0845   APPEARANCEUR CLEAR 01/09/2023 0845   LABSPEC 1.017 01/09/2023 0845   PHURINE 6.0 01/09/2023 0845   GLUCOSEU NEGATIVE 01/09/2023 0845   HGBUR MODERATE (A) 01/09/2023 0845   BILIRUBINUR NEGATIVE 01/09/2023 0845   KETONESUR 5 (A) 01/09/2023 0845   PROTEINUR NEGATIVE 01/09/2023 0845   UROBILINOGEN 0.2 01/24/2020 1802   NITRITE NEGATIVE 01/09/2023 0845   LEUKOCYTESUR NEGATIVE 01/09/2023 0845   Sepsis Labs Recent Labs  Lab 06/13/23 0436  WBC 8.0   Microbiology No results found for this or any previous visit (from the past 240 hours).   Time coordinating discharge: 25 minutes  SIGNED: Lanae Boast, MD  [facility] 06/14/2023, 12:58 PM  If 7PM-7AM, please contact night-coverage [email or web address]

## 2023-06-14 NOTE — ED Notes (Addendum)
 Per MD Kc, hold 1000 meds for potential procedure if echo is abnormal

## 2023-06-14 NOTE — Progress Notes (Signed)
   Patient Name: Alline Pio Date of Encounter: 06/14/2023 [facility] Cardiologist: None [place]  Interval Summary  .    No recurrent chest pain. Echo normal. Heading to CT for Cor CTA  Vital Signs .    Vitals:   06/14/23 0900 06/14/23 1026 06/14/23 1038 06/14/23 1045  BP: 116/77 (!) 134/94 139/81 135/84  Pulse: 78 92 71 71  Resp: (!) 21 14 (!) 25 17  Temp: 98.3 F (36.8 C)     TempSrc:      SpO2: 100% 100% 100% 100%  Weight:      Height:       No intake or output data in the 24 hours ending 06/14/23 1122    06/13/2023    4:31 AM 09/19/2022   11:49 AM 04/30/2021    1:47 PM  Last 3 Weights  Weight (lbs) 155 lb 165 lb 169 lb 3.2 oz  Weight (kg) 70.308 kg 74.844 kg 76.749 kg      Telemetry/ECG    SR - Personally Reviewed  Physical Exam .   GEN: No acute distress.   Neck: No JVD Cardiac: RRR, no murmurs, rubs, or gallops.  Respiratory: Clear to auscultation bilaterally. GI: Soft, nontender, non-distended  MS: No edema  Assessment & Plan .     #CP, possibly cardiac - trops negative. - reviewed CTA PE, no cor cal. Will plan for coronary CTA today to define coronary anatomy in setting of chest pain. - echo normal.  #hyperlipidemia - likely needs a statin, will review after coronary cta.   For questions or updates, please contact [facility] Please consult [email or web address] for contact info under        Signed, Parke Poisson, MD

## 2023-06-14 NOTE — ED Notes (Signed)
 Echo at bedside

## 2023-06-14 NOTE — ED Notes (Signed)
Dietary called for patient's tray.

## 2023-06-14 NOTE — ED Notes (Signed)
 Patient transported to CT

## 2023-06-14 NOTE — ED Notes (Signed)
 PA at bedside.

## 2023-06-28 ENCOUNTER — Ambulatory Visit: Attending: Nurse Practitioner | Admitting: Nurse Practitioner

## 2023-06-28 ENCOUNTER — Encounter: Payer: Self-pay | Admitting: Nurse Practitioner

## 2023-06-28 VITALS — BP 134/80 | HR 81 | Ht 61.0 in | Wt 155.0 lb

## 2023-06-28 DIAGNOSIS — I10 Essential (primary) hypertension: Secondary | ICD-10-CM | POA: Diagnosis not present

## 2023-06-28 DIAGNOSIS — E119 Type 2 diabetes mellitus without complications: Secondary | ICD-10-CM

## 2023-06-28 DIAGNOSIS — E782 Mixed hyperlipidemia: Secondary | ICD-10-CM | POA: Diagnosis not present

## 2023-06-28 DIAGNOSIS — R072 Precordial pain: Secondary | ICD-10-CM | POA: Diagnosis not present

## 2023-06-28 NOTE — Patient Instructions (Signed)
 Medication Instructions:  Your physician recommends that you continue on your current medications as directed. Please refer to the Current Medication list given to you today.  *If you need a refill on your cardiac medications before your next appointment, please call your pharmacy*  Follow-Up: At [facility], you and your health needs are our priority.  As part of our continuing mission to provide you with exceptional heart care, we have created designated Provider Care Teams.  These Care Teams include your primary Cardiologist (physician) and Advanced Practice Providers (APPs -  Physician Assistants and Nurse Practitioners) who all work together to provide you with the care you need, when you need it.  We recommend signing up for the patient portal called "MyChart".  Sign up information is provided on this After Visit Summary.  MyChart is used to connect with patients for Virtual Visits (Telemedicine).  Patients are able to view lab/test results, encounter notes, upcoming appointments, etc.  Non-urgent messages can be sent to your provider as well.   To learn more about what you can do with MyChart, go to [email or web address].    Your next appointment:   12 month(s)  Provider:   Parke Poisson, MD     Other Instructions

## 2023-06-28 NOTE — Progress Notes (Signed)
 Office Visit    Patient Name: Audrey Rodriguez Date of Encounter: 06/28/2023  Primary Care Provider:  Fleet Contras, MD Primary Cardiologist:  Parke Poisson, MD  Chief Complaint    53 year old female with a history of cchest pain, hypertension, hyperlipidemia, type 2 diabetes, arthritis, and GERD who presents for hospital follow-up related to chest pain.  Past Medical History    Past Medical History:  Diagnosis Date   Anemia    Ankle swelling    for 5 years   Anxiety    Arthritis    swelling in left ankle at times    GERD (gastroesophageal reflux disease)    no meds   Heart murmur    Hemorrhoids    since child birth, 25 years ago   Hypercholesteremia    Past Surgical History:  Procedure Laterality Date   CESAREAN SECTION     x 5    Allergies  No Known Allergies   Labs/Other Studies Reviewed    The following studies were reviewed today:  Cardiac Studies & Procedures   ______________________________________________________________________________________________     ECHOCARDIOGRAM  ECHOCARDIOGRAM COMPLETE 06/14/2023  Narrative ECHOCARDIOGRAM REPORT    Patient Name:   Audrey Rodriguez Date of Exam: 06/14/2023 Medical Rec #:  [id number]   Height:       61.0 in Accession #:    [id number]  Weight:       155.0 lb Date of Birth:  [birth year]   BSA:          1.695 m Patient Age:    53 years    BP:           112/65 mmHg Patient Gender: F           HR:           76 bpm. Exam Location:  Inpatient  Procedure: 2D Echo, Color Doppler, Cardiac Doppler and Intracardiac Opacification Agent (Both Spectral and Color Flow Doppler were utilized during procedure).  Indications:    Chest Pain  History:        Patient has prior history of Echocardiogram examinations. Risk Factors:Diabetes.  Sonographer:    Lamont Snowball Referring Phys: Dorothe Pea BRANCH  IMPRESSIONS   1. Left ventricular ejection fraction, by estimation, is 60 to 65%. The left ventricle has normal  function. The left ventricle has no regional wall motion abnormalities. There is mild concentric left ventricular hypertrophy. Left ventricular diastolic parameters were normal. 2. Right ventricular systolic function is normal. The right ventricular size is normal. 3. The mitral valve is normal in structure. Trivial mitral valve regurgitation. No evidence of mitral stenosis. 4. The aortic valve is normal in structure. Aortic valve regurgitation is not visualized. No aortic stenosis is present. 5. The inferior vena cava is normal in size with greater than 50% respiratory variability, suggesting right atrial pressure of 3 mmHg.  FINDINGS Left Ventricle: Left ventricular ejection fraction, by estimation, is 60 to 65%. The left ventricle has normal function. The left ventricle has no regional wall motion abnormalities. The left ventricular internal cavity size was normal in size. There is mild concentric left ventricular hypertrophy. Left ventricular diastolic parameters were normal.  Right Ventricle: The right ventricular size is normal. No increase in right ventricular wall thickness. Right ventricular systolic function is normal.  Left Atrium: Left atrial size was normal in size.  Right Atrium: Right atrial size was normal in size.  Pericardium: There is no evidence of pericardial effusion.  Mitral Valve: The mitral valve is normal  in structure. Trivial mitral valve regurgitation. No evidence of mitral valve stenosis. MV peak gradient, 3.1 mmHg. The mean mitral valve gradient is 2.0 mmHg.  Tricuspid Valve: The tricuspid valve is normal in structure. Tricuspid valve regurgitation is trivial. No evidence of tricuspid stenosis.  Aortic Valve: The aortic valve is normal in structure. Aortic valve regurgitation is not visualized. No aortic stenosis is present. Aortic valve peak gradient measures 11.2 mmHg.  Pulmonic Valve: The pulmonic valve was normal in structure. Pulmonic valve regurgitation is  trivial. No evidence of pulmonic stenosis.  Aorta: The aortic root is normal in size and structure.  Venous: The inferior vena cava is normal in size with greater than 50% respiratory variability, suggesting right atrial pressure of 3 mmHg.  IAS/Shunts: No atrial level shunt detected by color flow Doppler.   LEFT VENTRICLE PLAX 2D LVIDd:         3.50 cm   Diastology LVIDs:         2.50 cm   LV e' medial:    9.25 cm/s LV PW:         1.30 cm   LV E/e' medial:  9.4 LV IVS:        1.30 cm   LV e' lateral:   11.20 cm/s LVOT diam:     2.20 cm   LV E/e' lateral: 7.8 LV SV:         71 LV SV Index:   42 LVOT Area:     3.80 cm   RIGHT VENTRICLE             IVC RV Basal diam:  3.50 cm     IVC diam: 1.20 cm RV S prime:     11.00 cm/s TAPSE (M-mode): 2.3 cm  LEFT ATRIUM             Index        RIGHT ATRIUM           Index LA diam:        3.50 cm 2.07 cm/m   RA Area:     10.90 cm LA Vol (A2C):   41.8 ml 24.66 ml/m  RA Volume:   22.80 ml  13.45 ml/m LA Vol (A4C):   43.5 ml 25.67 ml/m LA Biplane Vol: 44.1 ml 26.02 ml/m AORTIC VALVE AV Area (Vmax): 2.22 cm AV Vmax:        167.00 cm/s AV Peak Grad:   11.2 mmHg LVOT Vmax:      97.40 cm/s LVOT Vmean:     65.000 cm/s LVOT VTI:       0.188 m  AORTA Ao Root diam: 2.80 cm Ao Asc diam:  2.90 cm  MITRAL VALVE MV Area (PHT): 3.31 cm    SHUNTS MV Area VTI:   3.00 cm    Systemic VTI:  0.19 m MV Peak grad:  3.1 mmHg    Systemic Diam: 2.20 cm MV Mean grad:  2.0 mmHg MV Vmax:       0.88 m/s MV Vmean:      60.0 cm/s MV Decel Time: 229 msec MV E velocity: 87.00 cm/s MV A velocity: 74.40 cm/s MV E/A ratio:  1.17  Arvilla Meres MD Electronically signed by Arvilla Meres MD Signature Date/Time: 06/14/2023/10:05:42 AM    Final      CT SCANS  CT CORONARY MORPH W/CTA COR W/SCORE 06/14/2023  Addendum 06/14/2023  3:28 PM ADDENDUM REPORT: 06/14/2023 15:25  EXAM: OVER-READ INTERPRETATION  CT CHEST  The following report  is  an over-read performed by radiologist Dr. Narda Rutherford of [facility] on 06/14/2023. This over-read does not include interpretation of cardiac or coronary anatomy or pathology. The coronary CTA interpretation by the cardiologist is attached.  COMPARISON:  Chest CTA yesterday  FINDINGS: Vascular: No aortic atherosclerosis. The included aorta is normal in caliber.  Mediastinum/nodes: No adenopathy or mass. Unremarkable esophagus.  Lungs: No focal airspace disease. Unchanged 4 mm right lower lobe nodule since yesterday series 11, image 12. No pleural fluid. The included airways are patent.  Upper abdomen: No acute or unexpected findings.  Musculoskeletal: There are no acute or suspicious osseous abnormalities.  IMPRESSION: Unchanged 4 mm right lower lobe nodule since yesterday. No follow-up needed if patient is low-risk.This recommendation follows the consensus statement: Guidelines for Management of Incidental Pulmonary Nodules Detected on CT Images: From the Fleischner Society 2017; Radiology 2017; 284:228-243.   Electronically Signed By: Narda Rutherford M.D. On: 06/14/2023 15:25  Narrative CLINICAL DATA:  Chest pain  EXAM: Cardiac/Coronary CTA  TECHNIQUE: A non-contrast, gated CT scan was obtained with axial slices of 3 mm through the heart for calcium scoring. Calcium scoring was performed using the Agatston method. A 120 kV prospective, gated, contrast cardiac scan was obtained. Gantry rotation speed was 250 msecs and collimation was 0.6 mm. Two sublingual nitroglycerin tablets (0.8 mg) were given. The 3D data set was reconstructed in 5% intervals of the 35-75% of the R-R cycle. Diastolic phases were analyzed on a dedicated workstation using MPR, MIP, and VRT modes. The patient received 95 cc of contrast.  FINDINGS: Image quality: Excellent.  Noise artifact is: Limited.  Coronary Arteries:  Normal coronary origin.  Right dominance.  Left  main: The left main is a large caliber vessel with a normal take off from the left coronary cusp that bifurcates to form a left anterior descending artery and a left circumflex artery. There is no plaque or stenosis.  Left anterior descending artery: The LAD is patent without evidence of plaque or stenosis. The LAD gives off 3 patent diagonal branches.  Left circumflex artery: The LCX is non-dominant and patent with no evidence of plaque or stenosis. The LCX gives off 1 patent obtuse marginal branch.  Right coronary artery: The RCA is dominant with normal take off from the right coronary cusp. There is no evidence of plaque or stenosis. The distal RCA appears normal, but there is considerable stair step artifact that cannot be cleared. The RCA terminates as a PDA and right posterolateral branch which appear patent.  Right Atrium: Right atrial size is within normal limits.  Right Ventricle: The right ventricular cavity is within normal limits.  Left Atrium: Left atrial size is normal in size with no left atrial appendage filling defect. Small PFO.  Left Ventricle: The ventricular cavity size is within normal limits.  Pulmonary arteries: Normal in size.  Pulmonary veins: Normal pulmonary venous drainage.  Pericardium: Normal thickness without significant effusion or calcium present.  Cardiac valves: The aortic valve is trileaflet without significant calcification. The mitral valve is normal without significant calcification.  Aorta: Normal caliber without significant disease.  Extra-cardiac findings: See attached radiology report for non-cardiac structures.  IMPRESSION: 1. Coronary calcium score of 0.  2. Normal coronary origin with right dominance.  3. Normal LAD, LCX, and proximal to mid RCA.  4. Non-diagnostic distal RCA due to slab artifact that cannot be cleared.  5. Small PFO.  RECOMMENDATIONS: 1. CAD-RADS 0: No evidence of CAD (0%). Consider  non-atherosclerotic causes of chest pain.  Lennie Odor, MD  Electronically Signed: By: Lennie Odor M.D. On: 06/14/2023 12:20     ______________________________________________________________________________________________     Recent Labs: 06/13/2023: ALT 65; BUN 15; Creatinine, Ser 0.71; Hemoglobin 12.2; Platelets 292; Potassium 3.5; Sodium 139; TSH 1.279  Recent Lipid Panel    Component Value Date/Time   CHOL 228 (H) 06/13/2023 0659   TRIG 92 06/13/2023 0659   HDL 53 06/13/2023 0659   CHOLHDL 4.3 06/13/2023 0659   VLDL 18 06/13/2023 0659   LDLCALC 157 (H) 06/13/2023 0659   LDLCALC 141 (H) 11/24/2022 1446    History of Present Illness    53 year old female with the above past medical history including chest pain, hypertension, hyperlipidemia, type 2 diabetes, arthritis, and GERD.  She presented to the ED on 06/13/2023 in the setting of chest pain that woke her from her sleep.  Troponin was negative, EKG showed sinus rhythm with dynamic T wave inversions in inferior leads.  Cardiology was consulted.  Echocardiogram showed EF 60 to 65%, normal LV function, no RWMA, mild concentric LVH, normal RV systolic function, no significant valvular abnormalities.  Coronary CTA revealed coronary calcium score of 0, small PFO.  She was discharged home in stable condition.  She presents today for follow-up.  Since her recent ED visit she has been stable from a cardiac standpoint.  She denies any recurrent chest pain, palpitations, dizziness, dyspnea, edema, PND, orthopnea, weight gain.  She shares that her daughter died last 01-24-24.  She is still very much grieving her death.  She feels that her recent symptoms were more related to anxiety and stress.  Home Medications    Current Outpatient Medications  Medication Sig Dispense Refill   ACCU-CHEK GUIDE test strip TEST GLUCOSE EVERY DAY     aspirin EC 81 MG tablet Take 81 mg by mouth daily. Swallow whole.     cetirizine (ZYRTEC) 10 MG  tablet Take 10 mg by mouth daily as needed for allergies.     cyanocobalamin (,VITAMIN B-12,) 1000 MCG/ML injection Inject 1,000 mcg into the muscle every 30 (thirty) days.     famotidine (PEPCID) 20 MG tablet Take 1 tablet (20 mg total) by mouth 2 (two) times daily. (Patient taking differently: Take 20 mg by mouth 2 (two) times daily as needed for heartburn.) 30 tablet 0   fluticasone (FLONASE) 50 MCG/ACT nasal spray Place 2 sprays into both nostrils daily as needed for allergies.     hydrOXYzine (ATARAX) 25 MG tablet Take 25 mg by mouth at bedtime.     losartan (COZAAR) 25 MG tablet Take 25 mg by mouth daily.     metFORMIN (GLUCOPHAGE) 500 MG tablet Take 500 mg by mouth daily.     OZEMPIC, 1 MG/DOSE, 4 MG/3ML SOPN Inject 1 mg into the skin once a week.     simvastatin (ZOCOR) 40 MG tablet Take 20 mg by mouth at bedtime.     Vitamin D, Ergocalciferol, (DRISDOL) 1.25 MG (50000 UNIT) CAPS capsule Take 50,000 Units by mouth every 7 (seven) days. on Sunday     VITAMIN K PO Take 1 tablet by mouth daily.     No current facility-administered medications for this visit.     Review of Systems    She denies chest pain, palpitations, dyspnea, pnd, orthopnea, n, v, dizziness, syncope, edema, weight gain, or early satiety. All other systems reviewed and are otherwise negative except as noted above.   Physical Exam    VS:  BP 134/80   Pulse 81   Ht 5\' 1"  (1.549 m)   Wt 155 lb (70.3 kg)   SpO2 96%   BMI 29.29 kg/m  GEN: Well nourished, well developed, in no acute distress. HEENT: normal. Neck: Supple, no JVD, carotid bruits, or masses. Cardiac: RRR, no murmurs, rubs, or gallops. No clubbing, cyanosis, edema.  Radials/DP/PT 2+ and equal bilaterally.  Respiratory:  Respirations regular and unlabored, clear to auscultation bilaterally. GI: Soft, nontender, nondistended, BS + x 4. MS: no deformity or atrophy. Skin: warm and dry, no rash. Neuro:  Strength and sensation are intact. Psych: Normal  affect.  Accessory Clinical Findings    ECG personally reviewed by me today - EKG Interpretation Date/Time:  Monday June 28 2023 14:21:22 EDT Ventricular Rate:  81 PR Interval:  150 QRS Duration:  82 QT Interval:  354 QTC Calculation: 411 R Axis:   2  Text Interpretation: Normal sinus rhythm Normal ECG When compared with ECG of 14-Jun-2023 05:40, PREVIOUS ECG IS PRESENT Confirmed by Bernadene Person ([id number]) on 06/28/2023 2:48:01 PM  - no acute changes.   Lab Results  Component Value Date   WBC 8.0 06/13/2023   HGB 12.2 06/13/2023   HCT 38.0 06/13/2023   MCV 83.7 06/13/2023   PLT 292 06/13/2023   Lab Results  Component Value Date   CREATININE 0.71 06/13/2023   BUN 15 06/13/2023   NA 139 06/13/2023   K 3.5 06/13/2023   CL 106 06/13/2023   CO2 22 06/13/2023   Lab Results  Component Value Date   ALT 65 (H) 06/13/2023   AST 87 (H) 06/13/2023   ALKPHOS 72 06/13/2023   BILITOT 0.6 06/13/2023   Lab Results  Component Value Date   CHOL 228 (H) 06/13/2023   HDL 53 06/13/2023   LDLCALC 157 (H) 06/13/2023   TRIG 92 06/13/2023   CHOLHDL 4.3 06/13/2023    Lab Results  Component Value Date   HGBA1C 6.1 (H) 06/13/2023    Assessment & Plan   1. Chest pain: Recent ED visit in the setting of chest pain.  Troponin was negative, EKG showed sinus rhythm with dynamic T wave inversions in inferior leads.  Echocardiogram showed EF 60 to 65%, normal LV function, no RWMA, mild concentric LVH, normal RV systolic function, no significant valvular abnormalities.  Coronary CTA revealed coronary calcium score of 0, small PFO.  She shares that her symptoms may have been brought on by stress.  She is still very much mourning the death of her daughter last January 26, 2024. She denies any further symptoms concerning for angina.  Workup to date reassuring, continue to monitor symptoms, reviewed ED precautions.    2. Hypertension: BP well controlled. Continue current antihypertensive regimen.   3.  Hyperlipidemia: LDL was 157 in 06/2023.  She does not think that she was fasting for these labs. The 10-year ASCVD risk score (Arnett DK, et al., 2019) is: 15.4%. She would likely benefit from escalation of statin therapy in the future.  Consider repeat fasting labs in the near future.  She will discuss this with her PCP.  Continue statin.  4. Type 2 diabetes: A1c was 6.1 in 06/2023.  Monitored and managed per PCP.  5. Disposition:  Follow-up in 1 year, sooner if needed.      Joylene Grapes, NP 06/28/2023, 2:50 PM

## 2024-03-23 ENCOUNTER — Other Ambulatory Visit: Payer: Self-pay | Admitting: Internal Medicine

## 2024-03-23 DIAGNOSIS — Z1231 Encounter for screening mammogram for malignant neoplasm of breast: Secondary | ICD-10-CM

## 2024-04-19 ENCOUNTER — Ambulatory Visit

## 2024-04-27 ENCOUNTER — Ambulatory Visit
Admission: RE | Admit: 2024-04-27 | Discharge: 2024-04-27 | Disposition: A | Discharge status: Home / Self Care | Source: Ambulatory Visit | Attending: Internal Medicine | Admitting: Internal Medicine

## 2024-04-27 DIAGNOSIS — Z1231 Encounter for screening mammogram for malignant neoplasm of breast: Secondary | ICD-10-CM
